# Patient Record
Sex: Male | Born: 1965 | Race: Black or African American | Hispanic: No | Marital: Single
Health system: Southern US, Community
[De-identification: ages and names within clinical notes are randomized; demographics above are authoritative.]

## PROBLEM LIST (undated history)

## (undated) SURGICAL SUPPLY — 1 items: SCREW CANN L THRD/28 4.0 (Screw) IMPLANT

---

## 2023-05-06 DIAGNOSIS — S12100A Unspecified displaced fracture of second cervical vertebra, initial encounter for closed fracture: Secondary | ICD-10-CM

## 2023-05-06 DIAGNOSIS — E1140 Type 2 diabetes mellitus with diabetic neuropathy, unspecified: Secondary | ICD-10-CM | POA: Diagnosis not present

## 2023-05-06 DIAGNOSIS — K922 Gastrointestinal hemorrhage, unspecified: Secondary | ICD-10-CM

## 2023-05-06 DIAGNOSIS — S065XAA Traumatic subdural hemorrhage with loss of consciousness status unknown, initial encounter: Secondary | ICD-10-CM

## 2023-05-06 DIAGNOSIS — S022XXA Fracture of nasal bones, initial encounter for closed fracture: Secondary | ICD-10-CM

## 2023-05-06 DIAGNOSIS — F05 Delirium due to known physiological condition: Secondary | ICD-10-CM

## 2023-05-06 DIAGNOSIS — I469 Cardiac arrest, cause unspecified: Principal | ICD-10-CM | POA: Diagnosis present

## 2023-05-06 DIAGNOSIS — S12001A Unspecified nondisplaced fracture of first cervical vertebra, initial encounter for closed fracture: Secondary | ICD-10-CM

## 2023-05-06 MED ORDER — ACETAMINOPHEN 325 MG PO TABS
650.0000 mg | ORAL_TABLET | ORAL | Status: DC | PRN
Start: 2023-05-08 — End: 2023-05-10

## 2023-05-06 MED ORDER — NOREPINEPHRINE 4 MG/250ML-% IV SOLN
2.0000 ug/min | INTRAVENOUS | Status: DC
Start: 2023-05-06 — End: 2023-05-11

## 2023-05-06 NOTE — ED Triage Notes (Signed)
Pt BIB EMS for cardiac arrest. Pt was leaving [employer] shop and collapsed outside. Pt hit head, arrives with c-collar being bagged through I-gel. Fire states rhythm was asystole, 2 rounds of CPR performed.  BP 260.

## 2023-05-06 NOTE — Progress Notes (Signed)
RT assisted with transport of this pt from [facility] in ED to CT and back while on full ventilatory support. Pt tolerated well with SVS.

## 2023-05-06 NOTE — Consult Note (Signed)
Reason for Consult:Cervical spine fracture Referring Physician: Dr. Francee Nodal is an 57 y.o. male.  HPI: The patient is a 57 year old individual is brought in cardiac arrest.  He underwent CPR for 2 rounds before he recovered ROSC.  He was intubated.  He appears to have struck his head when he fell out.  Because of this a CT of the head and neck were performed and the cervical CT demonstrates that the patient has a type II odontoid fracture with about 4 mm of distraction between the base of the odontoid and the PEG there is also some evidence of epidural blood posteriorly beyond that there is a C1 fracture in the ring and at least 2 spots.  The alignment of the neck is good and the spinal canal appears to be all in good proportion and alignment.  The epidural posterior to the C2 body appears small.  At the current time the patient is postarrest he is breathing spontaneously but assisted on a ventilator.  He has demonstrated some spontaneous movements of the upper and lower extremities.  His eyes are open and blinks spontaneously but he is not following any commands verbally.  He does not grimace to pinch.  At this point is early in the postresuscitation phase and the patient does not appear to have regained full consciousness.  I do not believe that he is LOC then.  Reviewing the CT scan I note that he is in good alignment.  Will continue to observe his status and see how his recovery progresses from a neurologic perspective.  History reviewed. No pertinent past medical history.    History reviewed. No pertinent family history.  Social History:  has no history on file for tobacco use, alcohol use, and drug use.  Allergies: Not on File  Medications: I have not reviewed the patient's medications  Results for orders placed or performed during the hospital encounter of 05/06/23 (from the past 48 hours)  Comprehensive metabolic panel     Status: Abnormal   Collection Time: 05/06/23  4:21 PM   Result Value Ref Range   Sodium 137 135 - 145 mmol/L   Potassium 3.4 (L) 3.5 - 5.1 mmol/L   Chloride 98 98 - 111 mmol/L   CO2 28 22 - 32 mmol/L   Glucose, Bld 219 (H) 70 - 99 mg/dL    Comment: Glucose reference range applies only to samples taken after fasting for at least 8 hours.   BUN 12 6 - 20 mg/dL   Creatinine, Ser 1.61 0.61 - 1.24 mg/dL   Calcium 8.9 8.9 - 09.6 mg/dL   Total Protein 7.8 6.5 - 8.1 g/dL   Albumin 3.3 (L) 3.5 - 5.0 g/dL   AST 045 (H) 15 - 41 U/L   ALT 111 (H) 0 - 44 U/L   Alkaline Phosphatase 150 (H) 38 - 126 U/L   Total Bilirubin 0.8 <1.2 mg/dL   GFR, Estimated >40 >98 mL/min    Comment: (NOTE) Calculated using the CKD-EPI Creatinine Equation (2021)    Anion gap 11 5 - 15    Comment: Performed at [facility], [address], [place], [place] [address]  CBC     Status: Abnormal   Collection Time: 05/06/23  4:21 PM  Result Value Ref Range   WBC 10.1 4.0 - 10.5 K/uL   RBC 4.27 4.22 - 5.81 MIL/uL   Hemoglobin 12.0 (L) 13.0 - 17.0 g/dL   HCT 78.2 (L) 95.6 - 21.3 %  MCV 90.9 80.0 - 100.0 fL   MCH 28.1 26.0 - 34.0 pg   MCHC 30.9 30.0 - 36.0 g/dL   RDW 16.1 09.6 - 04.5 %   Platelets 360 150 - 400 K/uL   nRBC 0.0 0.0 - 0.2 %    Comment: Performed at [facility], [address] [place], [place] [address]  Ethanol     Status: None   Collection Time: 05/06/23  4:21 PM  Result Value Ref Range   Alcohol, Ethyl (B) <10 <10 mg/dL    Comment: (NOTE) Lowest detectable limit for serum alcohol is 10 mg/dL.  For medical purposes only. Performed at [facility], [address], [place], [place] [address]   Protime-INR     Status: Abnormal   Collection Time: 05/06/23  4:21 PM  Result Value Ref Range   Prothrombin Time 15.8 (H) 11.4 - 15.2 seconds   INR 1.2 0.8 - 1.2    Comment: (NOTE) INR goal varies based on device and disease states. Performed at [facility], [address] [place], [place] [address]   Sample to Blood Bank      Status: None   Collection Time: 05/06/23  4:21 PM  Result Value Ref Range   Blood Bank Specimen SAMPLE AVAILABLE FOR TESTING    Sample Expiration      05/09/2023,2359 Performed at [facility], [address] [place], [place] [address]   Troponin I (High Sensitivity)     Status: None   Collection Time: 05/06/23  4:22 PM  Result Value Ref Range   Troponin I (High Sensitivity) 7 <18 ng/L    Comment: (NOTE) Elevated high sensitivity troponin I (hsTnI) values and significant  changes across serial measurements may suggest ACS but many other  chronic and acute conditions are known to elevate hsTnI results.  Refer to the "Links" section for chest pain algorithms and additional  guidance. Performed at [facility], [address] [place], [place] [address]   Brain natriuretic peptide     Status: None   Collection Time: 05/06/23  4:22 PM  Result Value Ref Range   B Natriuretic Peptide 16.6 0.0 - 100.0 pg/mL    Comment: Performed at [facility], [address], [place], [place] [address]  CDS serology     Status: None   Collection Time: 05/06/23  4:22 PM  Result Value Ref Range   CDS serology specimen      SPECIMEN WILL BE HELD FOR 14 DAYS IF TESTING IS REQUIRED    Comment: Performed at [facility], [address], [place], [place] [address]  Urinalysis, Routine w reflex microscopic -Urine, Catheterized     Status: Abnormal   Collection Time: 05/06/23  4:28 PM  Result Value Ref Range   Color, Urine YELLOW YELLOW   APPearance CLEAR CLEAR   Specific Gravity, Urine 1.016 1.005 - 1.030   pH 5.0 5.0 - 8.0   Glucose, UA 150 (A) NEGATIVE mg/dL   Hgb urine dipstick SMALL (A) NEGATIVE   Bilirubin Urine NEGATIVE NEGATIVE   Ketones, ur NEGATIVE NEGATIVE mg/dL   Protein, ur 324 (A) NEGATIVE mg/dL   Nitrite NEGATIVE NEGATIVE   Leukocytes,Ua NEGATIVE NEGATIVE   RBC / HPF >50 0 - 5 RBC/hpf   WBC, UA 0-5 0 - 5 WBC/hpf   Bacteria, UA RARE (A) NONE SEEN   Squamous  Epithelial / HPF 0-5 0 - 5 /HPF   Hyaline  Casts, UA PRESENT     Comment: Performed at [facility], [address], [place], [place] [address]  POC CBG, ED     Status: Abnormal   Collection Time: 05/06/23  4:30 PM  Result Value Ref Range   Glucose-Capillary 204 (H) 70 - 99 mg/dL    Comment: Glucose reference range applies only to samples taken after fasting for at least 8 hours.  I-Stat Chem 8, ED     Status: Abnormal   Collection Time: 05/06/23  4:32 PM  Result Value Ref Range   Sodium 138 135 - 145 mmol/L   Potassium 3.4 (L) 3.5 - 5.1 mmol/L   Chloride 96 (L) 98 - 111 mmol/L   BUN 14 6 - 20 mg/dL   Creatinine, Ser 6.04 0.61 - 1.24 mg/dL   Glucose, Bld 540 (H) 70 - 99 mg/dL    Comment: Glucose reference range applies only to samples taken after fasting for at least 8 hours.   Calcium, Ion 1.10 (L) 1.15 - 1.40 mmol/L   TCO2 30 22 - 32 mmol/L   Hemoglobin 12.9 (L) 13.0 - 17.0 g/dL   HCT 98.1 (L) 19.1 - 47.8 %  I-Stat Lactic Acid, ED     Status: Abnormal   Collection Time: 05/06/23  4:33 PM  Result Value Ref Range   Lactic Acid, Venous 3.0 (HH) 0.5 - 1.9 mmol/L   Comment NOTIFIED PHYSICIAN   I-Stat arterial blood gas, ED     Status: Abnormal   Collection Time: 05/06/23  5:26 PM  Result Value Ref Range   pH, Arterial 7.397 7.35 - 7.45   pCO2 arterial 55.4 (H) 32 - 48 mmHg   pO2, Arterial 201 (H) 83 - 108 mmHg   Bicarbonate 34.6 (H) 20.0 - 28.0 mmol/L   TCO2 36 (H) 22 - 32 mmol/L   O2 Saturation 100 %   Acid-Base Excess 7.0 (H) 0.0 - 2.0 mmol/L   Sodium 135 135 - 145 mmol/L   Potassium 3.5 3.5 - 5.1 mmol/L   Calcium, Ion 1.17 1.15 - 1.40 mmol/L   HCT 38.0 (L) 39.0 - 52.0 %   Hemoglobin 12.9 (L) 13.0 - 17.0 g/dL   Patient temperature 29.5 F    Sample type ARTERIAL     CT ANGIO HEAD NECK W WO CM Result Date: 05/06/2023 CLINICAL DATA:  Cervical spine fracture with concern for arterial injury EXAM: CT ANGIOGRAPHY HEAD AND NECK WITH AND WITHOUT CONTRAST TECHNIQUE:  Multidetector CT imaging of the head and neck was performed using the standard protocol during bolus administration of intravenous contrast. Multiplanar CT image reconstructions and MIPs were obtained to evaluate the vascular anatomy. Carotid stenosis measurements (when applicable) are obtained utilizing NASCET criteria, using the distal internal carotid diameter as the denominator. RADIATION DOSE REDUCTION: This exam was performed according to the departmental dose-optimization program which includes automated exposure control, adjustment of the mA and/or kV according to patient size and/or use of iterative reconstruction technique. CONTRAST:  75mL OMNIPAQUE IOHEXOL 350 MG/ML SOLN COMPARISON:  No prior CTA available, correlation is made with 05/06/2023 CT head FINDINGS: CT HEAD FINDINGS Brain: Increasing hyperdensity along the posterior falx and tentorium, likely trace subdural hemorrhage, which was not notable on the prior exam. No evidence of acute infarct, hemorrhage, mass, mass effect, or midline shift. No hydrocephalus. Vascular: No hyperdense vessel. Skull: Negative for fracture or focal lesion. Right frontal scalp hematoma. Could Sinuses/Orbits: Mucosal thickening in the right greater than left maxillary sinus, ethmoid air cells,  and right greater than left frontal sinuses. Other: The mastoid air cells are well aerated. CTA NECK FINDINGS Aortic arch: Two-vessel arch with a common origin of the brachiocephalic and left common carotid arteries. Imaged portion shows no evidence of aneurysm or dissection. No significant stenosis of the major arch vessel origins. Right carotid system: No evidence of dissection, occlusion, or hemodynamically significant stenosis (greater than 50%). Left carotid system: No evidence of dissection, occlusion, or hemodynamically significant stenosis (greater than 50%). Vertebral arteries: The left vertebral artery is diminutive throughout its course. No definite abnormality is seen in  the left vertebral artery as it passes by the fracture of the left C1 vertebral artery foramen (series 10, image 170). The right vertebral artery is dominant and is patent from its origin to the skull base, without evidence of dissection or hemodynamically significant stenosis. Skeleton: For detailed osseous findings, please see same-day CT head and cervical spine. Compared to the recent CT cervical spine, there is now increased anterior displacement of the dens relative to the body of C2, measuring approximately 4 mm (series 12, image 123), previously up to 1 mm. Other neck: No acute finding. Upper chest: For findings in the thorax, please see same day CT chest. Review of the MIP images confirms the above findings CTA HEAD FINDINGS Anterior circulation: Both internal carotid arteries are patent to the termini, with calcifications but without significant stenosis. A1 segments patent. Normal anterior communicating artery. Anterior cerebral arteries are patent to their distal aspects without significant stenosis. No M1 stenosis or occlusion. MCA branches perfused to their distal aspects without significant stenosis. Posterior circulation: Vertebral arteries patent to the vertebrobasilar junction, with possible severe stenosis distal left V4 (series 10, image 136). Basilar patent to its distal aspect without significant stenosis. Superior cerebellar arteries patent proximally. Patent P1 segments. Severe stenosis in the left P2 (series 12, image 139). PCAs otherwise perfused to their distal aspects without significant stenosis. The bilateral posterior communicating arteries are not visualized. Venous sinuses: As permitted by contrast timing, patent. Anatomic variants: None significant. No evidence of aneurysm or vascular malformation. Review of the MIP images confirms the above findings IMPRESSION: 1. Increasing hyperdensity along the posterior falx and tentorium, likely trace subdural hemorrhage, which is new from the  prior exam. 2. The left vertebral artery is diminutive throughout its course. No definite acute abnormality is seen in the left vertebral artery as it passes by the fracture of the left C1 vertebral artery foramen. 3. Compared to the recent CT cervical spine, there is now increased anterior displacement of the dens relative to the body of C2, measuring approximately 4 mm, previously up to 1 mm. 4. Possible severe stenosis in the distal left V4 and left P2. Imaging results were communicated on 05/06/2023 at 6:33 pm to provider [facility] via secure text paging. Electronically Signed   By: Wiliam Ke M.D.   On: 05/06/2023 18:33   DG Chest Port 1 View Result Date: 05/06/2023 CLINICAL DATA:  Pain after trauma EXAM: PORTABLE CHEST 1 VIEW COMPARISON:  X-ray 04/15/2023 FINDINGS: ET tube in place with tip 3 cm above the carina proximally. Enteric tube in place with tip extending beneath the diaphragm. Underinflation with widened mediastinum. Enlarged cardiopericardial silhouette. Vascular congestion. No pneumothorax or effusion. Overlapping cardiac leads. Left shoulder arthroplasty seen at the edge of the imaging field. IMPRESSION: New ET tube and enteric tube. Underinflation with enlarged cardiopericardial silhouette with vascular congestion widened mediastinum. With changes recommend further workup with contrast CT when clinically appropriate  Electronically Signed   By: Karen Kays M.D.   On: 05/06/2023 18:26   CT Angio Chest PE W and/or Wo Contrast Result Date: 05/06/2023 CLINICAL DATA:  Cardiac arrest. Blunt abdominal trauma. Cervical spine fracture. EXAM: CT ANGIOGRAPHY CHEST CT ABDOMEN AND PELVIS WITH CONTRAST TECHNIQUE: Multidetector CT imaging of the chest was performed using the standard protocol during bolus administration of intravenous contrast. Multiplanar CT image reconstructions and MIPs were obtained to evaluate the vascular anatomy. Multidetector CT imaging of the abdomen and pelvis was performed  using the standard protocol during bolus administration of intravenous contrast. RADIATION DOSE REDUCTION: This exam was performed according to the departmental dose-optimization program which includes automated exposure control, adjustment of the mA and/or kV according to patient size and/or use of iterative reconstruction technique. CONTRAST:  90mL Omnipaque 350 for CT chest 75 cc Omnipaque 354 CT abdomen/pelvis performed about 70 minutes after the CT chest COMPARISON:  Chest radiograph 05/06/2023, CT abdomen 12/19/2018 FINDINGS: CTA CHEST FINDINGS Cardiovascular: Atherosclerotic calcification of the aortic arch. Moderate cardiomegaly. No filling defect is identified in the pulmonary arterial tree to suggest pulmonary embolus. Mediastinum/Nodes: Mild thyromegaly without focal nodule identified. Lungs/Pleura: Endotracheal tube satisfactorily positioned. Patchy bandlike airspace opacities in both lower lobes mostly from atelectasis, aspiration pneumonitis not excluded. There is also some dependent airspace opacity in the right upper lobe and bandlike atelectasis in the apicoposterior segment left upper lobe. Musculoskeletal: Degenerative glenohumeral arthropathy on the right with left shoulder arthroplasty. Thoracic spondylosis. Review of the MIP images confirms the above findings. CT ABDOMEN and PELVIS FINDINGS Hepatobiliary: Unremarkable Pancreas: Unremarkable Spleen: Absent.  Minimal regenerative splenic nodularity. Adrenals/Urinary Tract: Foley catheter satisfactorily positioned in the urinary bladder. Kidneys unremarkable. Stomach/Bowel: Nasogastric tube tip in the stomach body. Mild thickening of portions of the appendix up to 1.0 cm in diameter without surrounding inflammatory findings. Strictly speaking, mild appendicitis is not excluded, and appendiceal mass could also have this appearance. Vascular/Lymphatic: IVC filter noted with very small caliber IVC and with substantial venous collateral structures in  the abdomen and subcutaneous tissues as before. Mild aortoiliac atheromatous vascular calcifications. Reproductive: Unremarkable Other: No supplemental non-categorized findings. Musculoskeletal: Remote superior endplate compressions at L1 and L2, no change from 12/19/2018. Review of the MIP images confirms the above findings. IMPRESSION: 1. No filling defect is identified in the pulmonary arterial tree to suggest pulmonary embolus. 2. Patchy bandlike airspace opacities in both lower lobes mostly from atelectasis, aspiration pneumonitis not excluded. There is also some dependent airspace opacity in the right upper lobe and bandlike atelectasis in the apicoposterior segment left upper lobe. 3. Moderate cardiomegaly. 4. IVC filter noted with very small caliber IVC and with substantial venous collateral structures in the abdomen and subcutaneous tissues as before. 5. Mild thickening of portions of the appendix up to 1.0 cm in diameter without surrounding inflammatory findings. Strictly speaking, mild appendicitis is not excluded, and appendiceal mass could also have this appearance (although no appendiceal mass was visible on the CT from 12/19/2018). 6. Remote superior endplate compressions at L1 and L2, no change from 12/19/2018. 7. Aortic atherosclerosis. Aortic Atherosclerosis (ICD10-I70.0). Electronically Signed   By: Gaylyn Rong M.D.   On: 05/06/2023 18:25   CT ABDOMEN PELVIS W CONTRAST Result Date: 05/06/2023 CLINICAL DATA:  Cardiac arrest. Blunt abdominal trauma. Cervical spine fracture. EXAM: CT ANGIOGRAPHY CHEST CT ABDOMEN AND PELVIS WITH CONTRAST TECHNIQUE: Multidetector CT imaging of the chest was performed using the standard protocol during bolus administration of intravenous contrast. Multiplanar CT image reconstructions  and MIPs were obtained to evaluate the vascular anatomy. Multidetector CT imaging of the abdomen and pelvis was performed using the standard protocol during bolus administration  of intravenous contrast. RADIATION DOSE REDUCTION: This exam was performed according to the departmental dose-optimization program which includes automated exposure control, adjustment of the mA and/or kV according to patient size and/or use of iterative reconstruction technique. CONTRAST:  90mL Omnipaque 350 for CT chest 75 cc Omnipaque 354 CT abdomen/pelvis performed about 70 minutes after the CT chest COMPARISON:  Chest radiograph 05/06/2023, CT abdomen 12/19/2018 FINDINGS: CTA CHEST FINDINGS Cardiovascular: Atherosclerotic calcification of the aortic arch. Moderate cardiomegaly. No filling defect is identified in the pulmonary arterial tree to suggest pulmonary embolus. Mediastinum/Nodes: Mild thyromegaly without focal nodule identified. Lungs/Pleura: Endotracheal tube satisfactorily positioned. Patchy bandlike airspace opacities in both lower lobes mostly from atelectasis, aspiration pneumonitis not excluded. There is also some dependent airspace opacity in the right upper lobe and bandlike atelectasis in the apicoposterior segment left upper lobe. Musculoskeletal: Degenerative glenohumeral arthropathy on the right with left shoulder arthroplasty. Thoracic spondylosis. Review of the MIP images confirms the above findings. CT ABDOMEN and PELVIS FINDINGS Hepatobiliary: Unremarkable Pancreas: Unremarkable Spleen: Absent.  Minimal regenerative splenic nodularity. Adrenals/Urinary Tract: Foley catheter satisfactorily positioned in the urinary bladder. Kidneys unremarkable. Stomach/Bowel: Nasogastric tube tip in the stomach body. Mild thickening of portions of the appendix up to 1.0 cm in diameter without surrounding inflammatory findings. Strictly speaking, mild appendicitis is not excluded, and appendiceal mass could also have this appearance. Vascular/Lymphatic: IVC filter noted with very small caliber IVC and with substantial venous collateral structures in the abdomen and subcutaneous tissues as before. Mild  aortoiliac atheromatous vascular calcifications. Reproductive: Unremarkable Other: No supplemental non-categorized findings. Musculoskeletal: Remote superior endplate compressions at L1 and L2, no change from 12/19/2018. Review of the MIP images confirms the above findings. IMPRESSION: 1. No filling defect is identified in the pulmonary arterial tree to suggest pulmonary embolus. 2. Patchy bandlike airspace opacities in both lower lobes mostly from atelectasis, aspiration pneumonitis not excluded. There is also some dependent airspace opacity in the right upper lobe and bandlike atelectasis in the apicoposterior segment left upper lobe. 3. Moderate cardiomegaly. 4. IVC filter noted with very small caliber IVC and with substantial venous collateral structures in the abdomen and subcutaneous tissues as before. 5. Mild thickening of portions of the appendix up to 1.0 cm in diameter without surrounding inflammatory findings. Strictly speaking, mild appendicitis is not excluded, and appendiceal mass could also have this appearance (although no appendiceal mass was visible on the CT from 12/19/2018). 6. Remote superior endplate compressions at L1 and L2, no change from 12/19/2018. 7. Aortic atherosclerosis. Aortic Atherosclerosis (ICD10-I70.0). Electronically Signed   By: Gaylyn Rong M.D.   On: 05/06/2023 18:25   CT HEAD WO CONTRAST Result Date: 05/06/2023 CLINICAL DATA:  Cardiac arrest, collapse with head strike EXAM: CT HEAD WITHOUT CONTRAST CT CERVICAL SPINE WITHOUT CONTRAST TECHNIQUE: Multidetector CT imaging of the head and cervical spine was performed following the standard protocol without intravenous contrast. Multiplanar CT image reconstructions of the cervical spine were also generated. RADIATION DOSE REDUCTION: This exam was performed according to the departmental dose-optimization program which includes automated exposure control, adjustment of the mA and/or kV according to patient size and/or use  of iterative reconstruction technique. COMPARISON:  03/17/2023 CTA head and neck FINDINGS: CT HEAD FINDINGS Brain: No evidence of acute infarct, hemorrhage, mass, mass effect, or midline shift. No hydrocephalus or extra-axial fluid collection. Vascular: No  hyperdense vessel. Skull: Negative for calvarial fracture or focal lesion. Right frontal scalp hematoma. Fracture of the bilateral nasal bones. Possible fracture of the osseous nasal septum. Sinuses/Orbits: Partial opacification of the right frontal sinus and ethmoid air cells. No acute finding in the orbits. Other: The mastoid air cells are well aerated. CT CERVICAL SPINE FINDINGS Alignment: No traumatic listhesis. Relative to the [id number] cm, there is widening of the facets at C1-C2, left greater than right and at C2-C3. Skull base and vertebrae: Acute fracture through the base of the dens (series 10, image 28 and series 9, image 53), with approximately 4 mm of distraction. Additional multifocal fractures of the anterior and posterior arch of C1 (series 3, images 26-29 and series 9, images 42, 47, and 68), including a fracture involving the left C1 vertebral artery foramen (series 3, image 31). Soft tissues and spinal canal: Suspect prevertebral edema and epidural hemorrhage at C1-C2. Endotracheal and orogastric tubes are noted. Disc levels: Degenerative changes in the cervical spine.No high-grade spinal canal stenosis. Upper chest: For findings in the thorax, please see same day CT chest. IMPRESSION: 1. Acute fracture through the base of the dens, with approximately 4 mm of distraction. Additional multifocal fractures of the anterior and posterior arch of C1, including a fracture involving the left C1 vertebral artery foramen. CTA of the neck is recommended to evaluate for vertebral artery injury. 2. Widening of the facets at C1-C2, left greater than right, and at C2-C3, concerning for ligamentous injury. 3. Suspect prevertebral edema and epidural hemorrhage at  C1-C2. 4. Fracture of the bilateral nasal bones. Possible fracture of the osseous nasal septum. 5. No acute intracranial process. These results were called by telephone at the time of interpretation on 05/06/2023 at 5:36 pm to provider [facility] , who verbally acknowledged these results. Electronically Signed   By: Wiliam Ke M.D.   On: 05/06/2023 17:36   CT CERVICAL SPINE WO CONTRAST Result Date: 05/06/2023 CLINICAL DATA:  Cardiac arrest, collapse with head strike EXAM: CT HEAD WITHOUT CONTRAST CT CERVICAL SPINE WITHOUT CONTRAST TECHNIQUE: Multidetector CT imaging of the head and cervical spine was performed following the standard protocol without intravenous contrast. Multiplanar CT image reconstructions of the cervical spine were also generated. RADIATION DOSE REDUCTION: This exam was performed according to the departmental dose-optimization program which includes automated exposure control, adjustment of the mA and/or kV according to patient size and/or use of iterative reconstruction technique. COMPARISON:  03/17/2023 CTA head and neck FINDINGS: CT HEAD FINDINGS Brain: No evidence of acute infarct, hemorrhage, mass, mass effect, or midline shift. No hydrocephalus or extra-axial fluid collection. Vascular: No hyperdense vessel. Skull: Negative for calvarial fracture or focal lesion. Right frontal scalp hematoma. Fracture of the bilateral nasal bones. Possible fracture of the osseous nasal septum. Sinuses/Orbits: Partial opacification of the right frontal sinus and ethmoid air cells. No acute finding in the orbits. Other: The mastoid air cells are well aerated. CT CERVICAL SPINE FINDINGS Alignment: No traumatic listhesis. Relative to the [id number] cm, there is widening of the facets at C1-C2, left greater than right and at C2-C3. Skull base and vertebrae: Acute fracture through the base of the dens (series 10, image 28 and series 9, image 53), with approximately 4 mm of distraction. Additional  multifocal fractures of the anterior and posterior arch of C1 (series 3, images 26-29 and series 9, images 42, 47, and 68), including a fracture involving the left C1 vertebral artery foramen (series 3, image 31). Soft tissues and spinal  canal: Suspect prevertebral edema and epidural hemorrhage at C1-C2. Endotracheal and orogastric tubes are noted. Disc levels: Degenerative changes in the cervical spine.No high-grade spinal canal stenosis. Upper chest: For findings in the thorax, please see same day CT chest. IMPRESSION: 1. Acute fracture through the base of the dens, with approximately 4 mm of distraction. Additional multifocal fractures of the anterior and posterior arch of C1, including a fracture involving the left C1 vertebral artery foramen. CTA of the neck is recommended to evaluate for vertebral artery injury. 2. Widening of the facets at C1-C2, left greater than right, and at C2-C3, concerning for ligamentous injury. 3. Suspect prevertebral edema and epidural hemorrhage at C1-C2. 4. Fracture of the bilateral nasal bones. Possible fracture of the osseous nasal septum. 5. No acute intracranial process. These results were called by telephone at the time of interpretation on 05/06/2023 at 5:36 pm to provider [facility] , who verbally acknowledged these results. Electronically Signed   By: Wiliam Ke M.D.   On: 05/06/2023 17:36    Review of Systems  Unable to perform ROS: Acuity of condition   Blood pressure (!) 138/93, pulse (!) 103, temperature (!) 96.8 F (36 C), temperature source Bladder, resp. rate 14, height 6\' 2"  (1.88 m), weight 127 kg, SpO2 100%. Physical Exam Constitutional:      Appearance: He is obese.  HENT:     Head: Normocephalic.     Comments: Small laceration in the frontal region that his crusted over with blood.    Nose: Nose normal.     Mouth/Throat:     Mouth: Mucous membranes are moist.  Eyes:     Conjunctiva/sclera: Conjunctivae normal.     Comments: Pupils are  equal and briskly reactive.  Cannot test accommodation.  Cannot test extraocular movements.  Neurological:     Comments: Patient is recently postarrest.  He has some spontaneous respirations above the ventilator.  He has demonstrated some spontaneous movement in the upper and lower extremities.  He does not move to command does not respond to painful stimuli at this time.     Assessment/Plan: C1 and C2 fractures.  Good anatomic alignment.  Small epidural hematoma.  Status post cardiac arrest.  Plan: The patient is in a hard cervical collar to maintain his alignment.  At this point I would allow him to recover further from the recent cardiac arrest and see if neurologic functionality improves further.  Ultimately would like to obtain an MRI of the cervical spine but I do not feel that that is emergent at the current time.  Will follow-up on the patient in the next day.  Shary Key Javoni Lucken 05/06/2023, 8:07 PM

## 2023-05-06 NOTE — H&P (Signed)
NAMEMasen Henderson, MRN:  [id number], DOB:  [birth year], LOS: 0 ADMISSION DATE:  05/06/2023, CONSULTATION DATE:  05/06/2023 REFERRING MD:  Dr. Karene Fry - EDP, CHIEF COMPLAINT:  Cardiac arrest    History of Present Illness:  Glenn Henderson is a 57 year old male with past medical history significant for hyperlipidemia, OSA, history of hepatitis, MDD, anxiety, PTSD, prior tobacco abuse, DM2 with peripheral neuropathy and latest A1c 12.4, chronic radicular lumbar pain with chronic pain syndrome, history of DVT and PE on chronic anticoagulation with Eliquis, TBI 2013 with left-sided deficits, history of seizures, abnormal thyroid disorder, who presented to the ED 12/16 via EMS after suffering out-of-hospital cardiac arrest.  Patient required 2 rounds of CPR prior to ROSC. Per EMS patient did hit head when he arrested, arrived in c-collar. On arrival patient was seen hypertensive, mildly hypothermic and tachycardic. Lab work significant for K 3.4, glucose 219, alkaline phosphastat 150, albumin 3.3, AST 142, ALT 111, lactic 3.0, Hgb 12.0. CT cervical spine on admit positive for acute unstable fracture with epidural hemorrhage. PCCM consulted for further assistance and admisison  Pertinent  Medical History  HTN, OSA, diabetes, prior CVA, TBI with hemipparesis, CKD, DVT and PE   Significant Hospital Events: Including procedures, antibiotic start and stop dates in addition to other pertinent events   12/16 admitted post cardiac arrest  Interim History / Subjective:  As above   Objective   Blood pressure (!) 172/117, pulse (!) 111, temperature (!) 95.9 F (35.5 C), temperature source Oral, resp. rate 16, height 6\' 2"  (1.88 m), weight 127 kg.    Vent Mode: PRVC FiO2 (%):  [40 %-100 %] 40 % Set Rate:  [14 bmp-16 bmp] 14 bmp Vt Set:  [650 mL] 650 mL PEEP:  [5 cmH20] 5 cmH20 Plateau Pressure:  [28 cmH20] 28 cmH20  No intake or output data in the 24 hours ending 05/06/23 1811 Filed Weights   05/06/23 1616   Weight: 127 kg    Examination: Deferred to attending   Resolved Hospital Problem list     Assessment & Plan:  Out of hospital cardiac arrest -Required 2 round CPR prior to ROSC  Essential HTN Hyperlipidemia  P: Goal of normothermia  Continuous telemetry  Assess CVP's, echo, UDS. Trend troponin, lactate. Consider cardiology consult  Optimize electrolytes   Respiratory insufficiency secondary to above  Hx of OSA Prior tobacco use P: Continue ventilator support with lung protective strategies  Wean PEEP and FiO2 for sats greater than 90%. Head of bed elevated 30 degrees. Plateau pressures less than 30 cm H20.  Follow intermittent chest x-ray and ABG.   SAT/SBT as tolerated, mentation preclude extubation  Ensure adequate pulmonary hygiene  Follow cultures  VAP bundle in place  PAD protocol  Unstable C-spine fracture with possible epidural hematoma  At risk anoxic injury  TBI 2013 with left-sided deficits, Prior CVA, TBI with hemipparesis P: Maintain neuro protective measures; goal for eurothermia, euglycemia, eunatermia, normoxia, and PCO2 goal of 35-40 Nutrition and bowel regiment  Seizure precautions  Aspirations precautions   History of DVT and PE on chronic anticoagulation with Eliquis,  P: Await neurosurgery consult to determine need to revers anticoagulation  Hold home Eliquis   History of hepatitis Elevated LFTs P: Consider ABD [place]  Trend LFTs  Avoid hepatotoxins   DM2 with peripheral neuropathy  -02/2023 A1c 12.4 P: SSI  CBG goal 140-180 CBG checks q4   Best Practice (right click and "Reselect all SmartList Selections" daily)   Diet/type: NPO  DVT prophylaxis SCD Pressure ulcer(s): N/A GI prophylaxis: PPI Lines: N/A Foley:  Yes, and it is still needed Code Status:  full code Last date of multidisciplinary goals of care discussion: Pending   Labs   CBC: Recent Labs  Lab 05/06/23 1621 05/06/23 1632 05/06/23 1726  WBC 10.1  --   --    HGB 12.0* 12.9* 12.9*  HCT 38.8* 38.0* 38.0*  MCV 90.9  --   --   PLT 360  --   --     Basic Metabolic Panel: Recent Labs  Lab 05/06/23 1621 05/06/23 1632 05/06/23 1726  NA 137 138 135  K 3.4* 3.4* 3.5  CL 98 96*  --   CO2 28  --   --   GLUCOSE 219* 232*  --   BUN 12 14  --   CREATININE 1.17 1.20  --   CALCIUM 8.9  --   --    GFR: Estimated Creatinine Clearance: 98.5 mL/min (by C-G formula based on SCr of 1.2 mg/dL). Recent Labs  Lab 05/06/23 1621 05/06/23 1633  WBC 10.1  --   LATICACIDVEN  --  3.0*    Liver Function Tests: Recent Labs  Lab 05/06/23 1621  AST 142*  ALT 111*  ALKPHOS 150*  BILITOT 0.8  PROT 7.8  ALBUMIN 3.3*   No results for input(s): "LIPASE", "AMYLASE" in the last 168 hours. No results for input(s): "AMMONIA" in the last 168 hours.  ABG    Component Value Date/Time   PHART 7.397 05/06/2023 1726   PCO2ART 55.4 (H) 05/06/2023 1726   PO2ART 201 (H) 05/06/2023 1726   HCO3 34.6 (H) 05/06/2023 1726   TCO2 36 (H) 05/06/2023 1726   O2SAT 100 05/06/2023 1726     Coagulation Profile: Recent Labs  Lab 05/06/23 1621  INR 1.2    Cardiac Enzymes: No results for input(s): "CKTOTAL", "CKMB", "CKMBINDEX", "TROPONINI" in the last 168 hours.  HbA1C: No results found for: "HGBA1C"  CBG: Recent Labs  Lab 05/06/23 1630  GLUCAP 204*    Review of Systems:   Unable to assess   Past Medical History:  He,  has no past medical history on file.   Surgical History:     Social History:      Family History:  His family history is not on file.   Allergies Not on File   Home Medications  Prior to Admission medications   Medication Sig Start Date End Date Taking? Authorizing Provider  acetaminophen-codeine (TYLENOL #3) 300-30 MG tablet Take 1 tablet by mouth every 4 (four) hours as needed. 04/22/23   [provider]  atorvastatin (LIPITOR) 40 MG tablet Take 40 mg by mouth daily. 04/13/23   [provider]   chlorhexidine (PERIDEX) 0.12 % solution Use as directed 15 mLs in the mouth or throat in the morning, at noon, and at bedtime. 04/22/23   [provider]  clonazePAM (KLONOPIN) 0.5 MG tablet Take 0.5 mg by mouth 2 (two) times daily as needed. 04/12/23   [provider]  cloNIDine (CATAPRES) 0.1 MG tablet Take 0.1 mg by mouth 2 (two) times daily as needed. 04/12/23   [provider]  DULoxetine (CYMBALTA) 30 MG capsule Take 30 mg by mouth every morning. 05/01/23   [provider]  ELIQUIS 5 MG TABS tablet Take 5 mg by mouth 2 (two) times daily. 03/18/23   [provider]  fluticasone (FLONASE) 50 MCG/ACT nasal spray Place 2 sprays into both nostrils at bedtime. 04/28/23  [provider]  furosemide (LASIX) 20 MG tablet Take 20 mg by mouth daily as needed. 04/01/23   [provider]  furosemide (LASIX) 40 MG tablet Take 40 mg by mouth daily as needed. 04/08/23   [provider]  gabapentin (NEURONTIN) 800 MG tablet Take 800 mg by mouth 3 (three) times daily. 04/09/23   [provider]  HUMALOG KWIKPEN 100 UNIT/ML KwikPen Inject 2-10 Units into the skin as directed. 04/08/23   [provider]  hydrochlorothiazide (HYDRODIURIL) 12.5 MG tablet Take 12.5 mg by mouth daily. 04/23/23   [provider]  losartan (COZAAR) 100 MG tablet Take 100 mg by mouth daily. 04/20/23   [provider]  mirtazapine (REMERON) 45 MG tablet Take 45 mg by mouth at bedtime. 05/01/23   [provider]  oxyCODONE-acetaminophen (PERCOCET/ROXICET) 5-325 MG tablet Take 1 tablet by mouth every 6 (six) hours as needed. 04/19/23   [provider]  sildenafil (REVATIO) 20 MG tablet Take 20 mg by mouth as needed. 04/19/23   [provider]  TRULICITY 1.5 MG/0.5ML SOAJ Inject 1.5 mg into the skin once a week. 03/29/23   [provider]  Vitamin D, Ergocalciferol, (DRISDOL) 1.25 MG (50000 UNIT) CAPS  capsule Take 50,000 Units by mouth once a week. 04/20/23   [provider]     Critical care time:   CRITICAL CARE Performed by: Arthea Nobel D. Doe   Total critical care time: 42 minutes  Critical care time was exclusive of separately billable procedures and treating other patients.  Critical care was necessary to treat or prevent imminent or life-threatening deterioration.  Critical care was time spent personally by me on the following activities: development of treatment plan with patient and/or surrogate as well as nursing, discussions with consultants, evaluation of patient's response to treatment, examination of patient, obtaining history from patient or surrogate, ordering and performing treatments and interventions, ordering and review of laboratory studies, ordering and review of radiographic studies, pulse oximetry and re-evaluation of patient's condition.  Gresham Caetano D. Flinchum, NP-C [facility] Personal contact information can be found on Amion  If no contact or response made please call [phone] 05/06/2023, 6:39 PM

## 2023-05-06 NOTE — Progress Notes (Signed)
   05/06/23 1740  Spiritual Encounters  Type of Visit Initial  Care provided to: Pt not available  Referral source Trauma page  Reason for visit Trauma  OnCall Visit No   Chaplain responded to a level one trauma. The patient was attended to by the medial team. I met briefly with family in the consult room. They declined chaplain support at this time.  If a chaplain is requested someone will respond.   Valerie Roys [facility]  [phone]

## 2023-05-06 NOTE — Progress Notes (Addendum)
eLink Physician-Brief Progress Note Patient Name: Glenn Henderson DOB: March 27, 1966 MRN: [id number]   Date of Service  05/06/2023  HPI/Events of Note  57 year old male with a history of obesity, and DVT, and pulmonary emboli on chronic anticoagulation that was initially brought in for cardiac arrest with a traumatic C1-C2 injury complicated by an epidural bleed in the cervical region and laceration in the frontal region.  On examination, the patient has normal vitals, saturating 99% on 40% FiO2 on the ventilator.  Currently sedated with propofol and continuous fentanyl.  Norepinephrine is off.  Laboratory studies show adequate oxygenation and ventilation on the gas.  Metabolic panel with some mild electrolyte abnormalities, transaminitis, and mild anemia.  Extensive trauma screen without evidence of PE, previously identified fractures noted, IVC filter in place.  Endotracheal tube in appropriate position, gastric tube terminates in the stomach.  eICU Interventions  Maintain mechanical ventilation, propofol and fentanyl as needed for sedation.  Daily awakening trials and spontaneous breathing trials.  Empiric Unasyn.  Avoid fevers.  Maintain cervical hard collar  Will follow CT head timed for 0100  DVT prophylaxis-apixaban, SCDs GI prophylaxis with famotidine   0238 -   Accounting for change in technique, the degree of bleeding appears relatively stable.  Will hold on reversing anticoagulation for now.  Defer additional interval scan to the neurosurgical team.  Will follow for the final radiology read.  Intervention Category Evaluation Type: New Patient Evaluation  Teala Daffron 05/06/2023, 8:21 PM

## 2023-05-06 NOTE — ED Notes (Signed)
Trauma RN and this RN exchanged c-collar to [employer]

## 2023-05-06 NOTE — Progress Notes (Signed)
Pharmacy Antibiotic Note  Glenn Henderson is a 57 y.o. male admitted on 05/06/2023 with aspiration pneumonia s/p cardiac arrest.  Pharmacy has been consulted for Unasyn (ampicillin-sulbactam) dosing.  WBC 10.1, Temp 96.8, LA 3 SCr 1.2  Plan: Unasyn 3g IV q6h  Monitor daily CBC, temp, SCr, and for clinical signs of improvement  F/u cultures and de-escalate antibiotics as able   Height: 6\' 2"  (188 cm) Weight: 127 kg (280 lb) IBW/kg (Calculated) : 82.2  Temp (24hrs), Avg:96.4 F (35.8 C), Min:95.9 F (35.5 C), Max:96.8 F (36 C)  Recent Labs  Lab 05/06/23 1621 05/06/23 1632 05/06/23 1633  WBC 10.1  --   --   CREATININE 1.17 1.20  --   LATICACIDVEN  --   --  3.0*    Estimated Creatinine Clearance: 98.5 mL/min (by C-G formula based on SCr of 1.2 mg/dL).    Not on File  Antimicrobials this admission: Unasyn 12/16 >>    Dose adjustments this admission: N/A  Microbiology results: None  Thank you for allowing pharmacy to be a part of this patient's care.  Wilburn Cornelia, PharmD, BCPS Clinical Pharmacist 05/06/2023 7:22 PM   Please refer to AMION for pharmacy phone number

## 2023-05-06 NOTE — ED Provider Notes (Signed)
[place] EMERGENCY DEPARTMENT AT [facility] Provider Note   CSN: [id number] Arrival date & time: 05/06/23  1609     History  Chief Complaint  Patient presents with   Cardiac Arrest    Nickolaos Noe is a 57 y.o. male.   Cardiac Arrest    57 year old male with medical history significant for hypertension, prior , OSA, diabetes mellitus on insulin, CVA, TBI with left hemiparesis, CKD, DVT and PE, depression presenting to the emergency department status post cardiac arrest.  Per EMS, the patient was leaving a barbershop and collapsed outside.  He struck his head and arrives with a c-collar in place, was being bagged through an Igel.  Rhythm noted for 2 rounds of CPR was PEA.  Subsequent ROSC with pressures in the 200s. He arrived GCS 7T (2-1-4). He was intubated for airway protection on arrival. CBG 204.  Home Medications Prior to Admission medications   Medication Sig Start Date End Date Taking? Authorizing Provider  acetaminophen-codeine (TYLENOL #3) 300-30 MG tablet Take 1 tablet by mouth every 4 (four) hours as needed. 04/22/23   [provider]  atorvastatin (LIPITOR) 40 MG tablet Take 40 mg by mouth daily. 04/13/23   [provider]  chlorhexidine (PERIDEX) 0.12 % solution Use as directed 15 mLs in the mouth or throat in the morning, at noon, and at bedtime. 04/22/23   [provider]  clonazePAM (KLONOPIN) 0.5 MG tablet Take 0.5 mg by mouth 2 (two) times daily as needed. 04/12/23   [provider]  cloNIDine (CATAPRES) 0.1 MG tablet Take 0.1 mg by mouth 2 (two) times daily as needed. 04/12/23   [provider]  DULoxetine (CYMBALTA) 30 MG capsule Take 30 mg by mouth every morning. 05/01/23   [provider]  ELIQUIS 5 MG TABS tablet Take 5 mg by mouth 2 (two) times daily. 03/18/23   [provider]  fluticasone (FLONASE) 50 MCG/ACT nasal spray Place 2 sprays into both nostrils at bedtime. 04/28/23   [provider]  furosemide (LASIX) 20 MG tablet Take 20 mg by mouth daily as needed. 04/01/23   [provider]  furosemide (LASIX) 40 MG tablet Take 40 mg by mouth daily as needed. 04/08/23   [provider]  gabapentin (NEURONTIN) 800 MG tablet Take 800 mg by mouth 3 (three) times daily. 04/09/23   [provider]  HUMALOG KWIKPEN 100 UNIT/ML KwikPen Inject 2-10 Units into the skin as directed. 04/08/23   [provider]  hydrochlorothiazide (HYDRODIURIL) 12.5 MG tablet Take 12.5 mg by mouth daily. 04/23/23   [provider]  losartan (COZAAR) 100 MG tablet Take 100 mg by mouth daily. 04/20/23   [provider]  mirtazapine (REMERON) 45 MG tablet Take 45 mg by mouth at bedtime. 05/01/23   [provider]  oxyCODONE-acetaminophen (PERCOCET/ROXICET) 5-325 MG tablet Take 1 tablet by mouth every 6 (six) hours as needed. 04/19/23   [provider]  sildenafil (REVATIO) 20 MG tablet Take 20 mg by mouth as needed. 04/19/23   [provider]  TRULICITY 1.5 MG/0.5ML SOAJ Inject 1.5 mg into the skin once a week. 03/29/23   [provider]  Vitamin D, Ergocalciferol, (DRISDOL) 1.25 MG (50000 UNIT) CAPS capsule Take 50,000 Units by mouth once a week. 04/20/23   [provider]      Allergies    Patient has no allergy information on record.    Review of Systems   Review of Systems  Unable to perform ROS: Acuity of condition    Physical Exam Updated Vital Signs BP (!) 156/94 (BP Location: Right Arm)   Pulse (!) 110   Temp (!) 96.8 F (36 C) (Bladder)   Resp 14   Ht 6\' 2"  (1.88 m)   Wt 127 kg   SpO2 100%   BMI 35.95 kg/m  Physical Exam Vitals and nursing note reviewed.  Constitutional:      Appearance: He is obese.  HENT:     Head: Normocephalic.     Comments: Abrasion to the left forehead Eyes:     Conjunctiva/sclera: Conjunctivae normal.     Comments: Pupils appear to be asymetric and  sluggish  Cardiovascular:     Rate and Rhythm: Regular rhythm. Tachycardia present.  Pulmonary:     Comments: Igel in place, actively being bagged Abdominal:     General: There is distension.     Comments: Anasarca present  Musculoskeletal:        General: No deformity or signs of injury.     Cervical back: Neck supple.  Skin:    Findings: No lesion or rash.  Neurological:     Mental Status: He is alert.     Comments: GCS 7T, pupils appear asymmetric, withdraws to pain in the right hemibody     ED Results / Procedures / Treatments   Labs (all labs ordered are listed, but only abnormal results are displayed) Labs Reviewed  COMPREHENSIVE METABOLIC PANEL - Abnormal; Notable for the following components:      Result Value   Potassium 3.4 (*)    Glucose, Bld 219 (*)    Albumin 3.3 (*)    AST 142 (*)    ALT 111 (*)    Alkaline Phosphatase 150 (*)    All other components within normal limits  CBC - Abnormal; Notable for the following components:   Hemoglobin 12.0 (*)    HCT 38.8 (*)    All other components within normal limits  URINALYSIS, ROUTINE W REFLEX MICROSCOPIC - Abnormal; Notable for the following components:   Glucose, UA 150 (*)    Hgb urine dipstick SMALL (*)    Protein, ur 100 (*)    Bacteria, UA RARE (*)    All other components within normal limits  PROTIME-INR - Abnormal; Notable for the following components:   Prothrombin Time 15.8 (*)    All other components within normal limits  I-STAT CHEM 8, ED - Abnormal; Notable for the following components:   Potassium 3.4 (*)    Chloride 96 (*)    Glucose, Bld 232 (*)    Calcium, Ion 1.10 (*)    Hemoglobin 12.9 (*)    HCT 38.0 (*)    All other components within normal limits  I-STAT CG4 LACTIC ACID, ED - Abnormal; Notable for the following components:   Lactic Acid, Venous 3.0 (*)    All other components within normal limits  I-STAT ARTERIAL BLOOD GAS, ED - Abnormal; Notable for the following components:   pCO2  arterial 55.4 (*)    pO2, Arterial 201 (*)    Bicarbonate 34.6 (*)    TCO2 36 (*)    Acid-Base Excess 7.0 (*)    HCT 38.0 (*)    Hemoglobin 12.9 (*)    All other components within normal limits  CBG MONITORING, ED - Abnormal; Notable for the following components:   Glucose-Capillary 204 (*)    All other components within normal limits  ETHANOL  BRAIN NATRIURETIC  PEPTIDE  CDS SEROLOGY  TRIGLYCERIDES  BLOOD GAS, ARTERIAL  HEMOGLOBIN A1C  HIV ANTIBODY (ROUTINE TESTING W REFLEX)  CBC  BASIC METABOLIC PANEL  MAGNESIUM  PHOSPHORUS  BLOOD GAS, ARTERIAL  SAMPLE TO BLOOD BANK  TROPONIN I (HIGH SENSITIVITY)  TROPONIN I (HIGH SENSITIVITY)    EKG EKG Interpretation Date/Time:  Monday May 06 2023 17:23:32 EST Ventricular Rate:  110 PR Interval:  169 QRS Duration:  91 QT Interval:  354 QTC Calculation: 479 R Axis:   40  Text Interpretation: Sinus tachycardia Borderline T abnormalities, inferior leads Borderline prolonged QT interval Confirmed by Ernie Avena ([id number]) on 05/06/2023 5:34:55 PM  Radiology CT ANGIO HEAD NECK W WO CM Result Date: 05/06/2023 CLINICAL DATA:  Cervical spine fracture with concern for arterial injury EXAM: CT ANGIOGRAPHY HEAD AND NECK WITH AND WITHOUT CONTRAST TECHNIQUE: Multidetector CT imaging of the head and neck was performed using the standard protocol during bolus administration of intravenous contrast. Multiplanar CT image reconstructions and MIPs were obtained to evaluate the vascular anatomy. Carotid stenosis measurements (when applicable) are obtained utilizing NASCET criteria, using the distal internal carotid diameter as the denominator. RADIATION DOSE REDUCTION: This exam was performed according to the departmental dose-optimization program which includes automated exposure control, adjustment of the mA and/or kV according to patient size and/or use of iterative reconstruction technique. CONTRAST:  75mL OMNIPAQUE IOHEXOL 350 MG/ML SOLN COMPARISON:   No prior CTA available, correlation is made with 05/06/2023 CT head FINDINGS: CT HEAD FINDINGS Brain: Increasing hyperdensity along the posterior falx and tentorium, likely trace subdural hemorrhage, which was not notable on the prior exam. No evidence of acute infarct, hemorrhage, mass, mass effect, or midline shift. No hydrocephalus. Vascular: No hyperdense vessel. Skull: Negative for fracture or focal lesion. Right frontal scalp hematoma. Could Sinuses/Orbits: Mucosal thickening in the right greater than left maxillary sinus, ethmoid air cells, and right greater than left frontal sinuses. Other: The mastoid air cells are well aerated. CTA NECK FINDINGS Aortic arch: Two-vessel arch with a common origin of the brachiocephalic and left common carotid arteries. Imaged portion shows no evidence of aneurysm or dissection. No significant stenosis of the major arch vessel origins. Right carotid system: No evidence of dissection, occlusion, or hemodynamically significant stenosis (greater than 50%). Left carotid system: No evidence of dissection, occlusion, or hemodynamically significant stenosis (greater than 50%). Vertebral arteries: The left vertebral artery is diminutive throughout its course. No definite abnormality is seen in the left vertebral artery as it passes by the fracture of the left C1 vertebral artery foramen (series 10, image 170). The right vertebral artery is dominant and is patent from its origin to the skull base, without evidence of dissection or hemodynamically significant stenosis. Skeleton: For detailed osseous findings, please see same-day CT head and cervical spine. Compared to the recent CT cervical spine, there is now increased anterior displacement of the dens relative to the body of C2, measuring approximately 4 mm (series 12, image 123), previously up to 1 mm. Other neck: No acute finding. Upper chest: For findings in the thorax, please see same day CT chest. Review of the MIP images  confirms the above findings CTA HEAD FINDINGS Anterior circulation: Both internal carotid arteries are patent to the termini, with calcifications but without significant stenosis. A1 segments patent. Normal anterior communicating artery. Anterior cerebral arteries are patent to their distal aspects without significant stenosis. No M1 stenosis or occlusion. MCA branches perfused to their distal aspects without significant stenosis. Posterior circulation: Vertebral arteries patent  to the vertebrobasilar junction, with possible severe stenosis distal left V4 (series 10, image 136). Basilar patent to its distal aspect without significant stenosis. Superior cerebellar arteries patent proximally. Patent P1 segments. Severe stenosis in the left P2 (series 12, image 139). PCAs otherwise perfused to their distal aspects without significant stenosis. The bilateral posterior communicating arteries are not visualized. Venous sinuses: As permitted by contrast timing, patent. Anatomic variants: None significant. No evidence of aneurysm or vascular malformation. Review of the MIP images confirms the above findings IMPRESSION: 1. Increasing hyperdensity along the posterior falx and tentorium, likely trace subdural hemorrhage, which is new from the prior exam. 2. The left vertebral artery is diminutive throughout its course. No definite acute abnormality is seen in the left vertebral artery as it passes by the fracture of the left C1 vertebral artery foramen. 3. Compared to the recent CT cervical spine, there is now increased anterior displacement of the dens relative to the body of C2, measuring approximately 4 mm, previously up to 1 mm. 4. Possible severe stenosis in the distal left V4 and left P2. Imaging results were communicated on 05/06/2023 at 6:33 pm to provider [facility] via secure text paging. Electronically Signed   By: Wiliam Ke M.D.   On: 05/06/2023 18:33   DG Chest Port 1 View Result Date: 05/06/2023 CLINICAL  DATA:  Pain after trauma EXAM: PORTABLE CHEST 1 VIEW COMPARISON:  X-ray 04/15/2023 FINDINGS: ET tube in place with tip 3 cm above the carina proximally. Enteric tube in place with tip extending beneath the diaphragm. Underinflation with widened mediastinum. Enlarged cardiopericardial silhouette. Vascular congestion. No pneumothorax or effusion. Overlapping cardiac leads. Left shoulder arthroplasty seen at the edge of the imaging field. IMPRESSION: New ET tube and enteric tube. Underinflation with enlarged cardiopericardial silhouette with vascular congestion widened mediastinum. With changes recommend further workup with contrast CT when clinically appropriate Electronically Signed   By: Karen Kays M.D.   On: 05/06/2023 18:26   CT Angio Chest PE W and/or Wo Contrast Result Date: 05/06/2023 CLINICAL DATA:  Cardiac arrest. Blunt abdominal trauma. Cervical spine fracture. EXAM: CT ANGIOGRAPHY CHEST CT ABDOMEN AND PELVIS WITH CONTRAST TECHNIQUE: Multidetector CT imaging of the chest was performed using the standard protocol during bolus administration of intravenous contrast. Multiplanar CT image reconstructions and MIPs were obtained to evaluate the vascular anatomy. Multidetector CT imaging of the abdomen and pelvis was performed using the standard protocol during bolus administration of intravenous contrast. RADIATION DOSE REDUCTION: This exam was performed according to the departmental dose-optimization program which includes automated exposure control, adjustment of the mA and/or kV according to patient size and/or use of iterative reconstruction technique. CONTRAST:  90mL Omnipaque 350 for CT chest 75 cc Omnipaque 354 CT abdomen/pelvis performed about 70 minutes after the CT chest COMPARISON:  Chest radiograph 05/06/2023, CT abdomen 12/19/2018 FINDINGS: CTA CHEST FINDINGS Cardiovascular: Atherosclerotic calcification of the aortic arch. Moderate cardiomegaly. No filling defect is identified in the pulmonary  arterial tree to suggest pulmonary embolus. Mediastinum/Nodes: Mild thyromegaly without focal nodule identified. Lungs/Pleura: Endotracheal tube satisfactorily positioned. Patchy bandlike airspace opacities in both lower lobes mostly from atelectasis, aspiration pneumonitis not excluded. There is also some dependent airspace opacity in the right upper lobe and bandlike atelectasis in the apicoposterior segment left upper lobe. Musculoskeletal: Degenerative glenohumeral arthropathy on the right with left shoulder arthroplasty. Thoracic spondylosis. Review of the MIP images confirms the above findings. CT ABDOMEN and PELVIS FINDINGS Hepatobiliary: Unremarkable Pancreas: Unremarkable Spleen: Absent.  Minimal regenerative splenic nodularity.  Adrenals/Urinary Tract: Foley catheter satisfactorily positioned in the urinary bladder. Kidneys unremarkable. Stomach/Bowel: Nasogastric tube tip in the stomach body. Mild thickening of portions of the appendix up to 1.0 cm in diameter without surrounding inflammatory findings. Strictly speaking, mild appendicitis is not excluded, and appendiceal mass could also have this appearance. Vascular/Lymphatic: IVC filter noted with very small caliber IVC and with substantial venous collateral structures in the abdomen and subcutaneous tissues as before. Mild aortoiliac atheromatous vascular calcifications. Reproductive: Unremarkable Other: No supplemental non-categorized findings. Musculoskeletal: Remote superior endplate compressions at L1 and L2, no change from 12/19/2018. Review of the MIP images confirms the above findings. IMPRESSION: 1. No filling defect is identified in the pulmonary arterial tree to suggest pulmonary embolus. 2. Patchy bandlike airspace opacities in both lower lobes mostly from atelectasis, aspiration pneumonitis not excluded. There is also some dependent airspace opacity in the right upper lobe and bandlike atelectasis in the apicoposterior segment left upper  lobe. 3. Moderate cardiomegaly. 4. IVC filter noted with very small caliber IVC and with substantial venous collateral structures in the abdomen and subcutaneous tissues as before. 5. Mild thickening of portions of the appendix up to 1.0 cm in diameter without surrounding inflammatory findings. Strictly speaking, mild appendicitis is not excluded, and appendiceal mass could also have this appearance (although no appendiceal mass was visible on the CT from 12/19/2018). 6. Remote superior endplate compressions at L1 and L2, no change from 12/19/2018. 7. Aortic atherosclerosis. Aortic Atherosclerosis (ICD10-I70.0). Electronically Signed   By: Gaylyn Rong M.D.   On: 05/06/2023 18:25   CT ABDOMEN PELVIS W CONTRAST Result Date: 05/06/2023 CLINICAL DATA:  Cardiac arrest. Blunt abdominal trauma. Cervical spine fracture. EXAM: CT ANGIOGRAPHY CHEST CT ABDOMEN AND PELVIS WITH CONTRAST TECHNIQUE: Multidetector CT imaging of the chest was performed using the standard protocol during bolus administration of intravenous contrast. Multiplanar CT image reconstructions and MIPs were obtained to evaluate the vascular anatomy. Multidetector CT imaging of the abdomen and pelvis was performed using the standard protocol during bolus administration of intravenous contrast. RADIATION DOSE REDUCTION: This exam was performed according to the departmental dose-optimization program which includes automated exposure control, adjustment of the mA and/or kV according to patient size and/or use of iterative reconstruction technique. CONTRAST:  90mL Omnipaque 350 for CT chest 75 cc Omnipaque 354 CT abdomen/pelvis performed about 70 minutes after the CT chest COMPARISON:  Chest radiograph 05/06/2023, CT abdomen 12/19/2018 FINDINGS: CTA CHEST FINDINGS Cardiovascular: Atherosclerotic calcification of the aortic arch. Moderate cardiomegaly. No filling defect is identified in the pulmonary arterial tree to suggest pulmonary embolus.  Mediastinum/Nodes: Mild thyromegaly without focal nodule identified. Lungs/Pleura: Endotracheal tube satisfactorily positioned. Patchy bandlike airspace opacities in both lower lobes mostly from atelectasis, aspiration pneumonitis not excluded. There is also some dependent airspace opacity in the right upper lobe and bandlike atelectasis in the apicoposterior segment left upper lobe. Musculoskeletal: Degenerative glenohumeral arthropathy on the right with left shoulder arthroplasty. Thoracic spondylosis. Review of the MIP images confirms the above findings. CT ABDOMEN and PELVIS FINDINGS Hepatobiliary: Unremarkable Pancreas: Unremarkable Spleen: Absent.  Minimal regenerative splenic nodularity. Adrenals/Urinary Tract: Foley catheter satisfactorily positioned in the urinary bladder. Kidneys unremarkable. Stomach/Bowel: Nasogastric tube tip in the stomach body. Mild thickening of portions of the appendix up to 1.0 cm in diameter without surrounding inflammatory findings. Strictly speaking, mild appendicitis is not excluded, and appendiceal mass could also have this appearance. Vascular/Lymphatic: IVC filter noted with very small caliber IVC and with substantial venous collateral structures in the abdomen and  subcutaneous tissues as before. Mild aortoiliac atheromatous vascular calcifications. Reproductive: Unremarkable Other: No supplemental non-categorized findings. Musculoskeletal: Remote superior endplate compressions at L1 and L2, no change from 12/19/2018. Review of the MIP images confirms the above findings. IMPRESSION: 1. No filling defect is identified in the pulmonary arterial tree to suggest pulmonary embolus. 2. Patchy bandlike airspace opacities in both lower lobes mostly from atelectasis, aspiration pneumonitis not excluded. There is also some dependent airspace opacity in the right upper lobe and bandlike atelectasis in the apicoposterior segment left upper lobe. 3. Moderate cardiomegaly. 4. IVC filter  noted with very small caliber IVC and with substantial venous collateral structures in the abdomen and subcutaneous tissues as before. 5. Mild thickening of portions of the appendix up to 1.0 cm in diameter without surrounding inflammatory findings. Strictly speaking, mild appendicitis is not excluded, and appendiceal mass could also have this appearance (although no appendiceal mass was visible on the CT from 12/19/2018). 6. Remote superior endplate compressions at L1 and L2, no change from 12/19/2018. 7. Aortic atherosclerosis. Aortic Atherosclerosis (ICD10-I70.0). Electronically Signed   By: Gaylyn Rong M.D.   On: 05/06/2023 18:25   CT HEAD WO CONTRAST Result Date: 05/06/2023 CLINICAL DATA:  Cardiac arrest, collapse with head strike EXAM: CT HEAD WITHOUT CONTRAST CT CERVICAL SPINE WITHOUT CONTRAST TECHNIQUE: Multidetector CT imaging of the head and cervical spine was performed following the standard protocol without intravenous contrast. Multiplanar CT image reconstructions of the cervical spine were also generated. RADIATION DOSE REDUCTION: This exam was performed according to the departmental dose-optimization program which includes automated exposure control, adjustment of the mA and/or kV according to patient size and/or use of iterative reconstruction technique. COMPARISON:  03/17/2023 CTA head and neck FINDINGS: CT HEAD FINDINGS Brain: No evidence of acute infarct, hemorrhage, mass, mass effect, or midline shift. No hydrocephalus or extra-axial fluid collection. Vascular: No hyperdense vessel. Skull: Negative for calvarial fracture or focal lesion. Right frontal scalp hematoma. Fracture of the bilateral nasal bones. Possible fracture of the osseous nasal septum. Sinuses/Orbits: Partial opacification of the right frontal sinus and ethmoid air cells. No acute finding in the orbits. Other: The mastoid air cells are well aerated. CT CERVICAL SPINE FINDINGS Alignment: No traumatic listhesis. Relative  to the [id number] cm, there is widening of the facets at C1-C2, left greater than right and at C2-C3. Skull base and vertebrae: Acute fracture through the base of the dens (series 10, image 28 and series 9, image 53), with approximately 4 mm of distraction. Additional multifocal fractures of the anterior and posterior arch of C1 (series 3, images 26-29 and series 9, images 42, 47, and 68), including a fracture involving the left C1 vertebral artery foramen (series 3, image 31). Soft tissues and spinal canal: Suspect prevertebral edema and epidural hemorrhage at C1-C2. Endotracheal and orogastric tubes are noted. Disc levels: Degenerative changes in the cervical spine.No high-grade spinal canal stenosis. Upper chest: For findings in the thorax, please see same day CT chest. IMPRESSION: 1. Acute fracture through the base of the dens, with approximately 4 mm of distraction. Additional multifocal fractures of the anterior and posterior arch of C1, including a fracture involving the left C1 vertebral artery foramen. CTA of the neck is recommended to evaluate for vertebral artery injury. 2. Widening of the facets at C1-C2, left greater than right, and at C2-C3, concerning for ligamentous injury. 3. Suspect prevertebral edema and epidural hemorrhage at C1-C2. 4. Fracture of the bilateral nasal bones. Possible fracture of the osseous nasal septum.  5. No acute intracranial process. These results were called by telephone at the time of interpretation on 05/06/2023 at 5:36 pm to provider [facility] , who verbally acknowledged these results. Electronically Signed   By: Wiliam Ke M.D.   On: 05/06/2023 17:36   CT CERVICAL SPINE WO CONTRAST Result Date: 05/06/2023 CLINICAL DATA:  Cardiac arrest, collapse with head strike EXAM: CT HEAD WITHOUT CONTRAST CT CERVICAL SPINE WITHOUT CONTRAST TECHNIQUE: Multidetector CT imaging of the head and cervical spine was performed following the standard protocol without intravenous  contrast. Multiplanar CT image reconstructions of the cervical spine were also generated. RADIATION DOSE REDUCTION: This exam was performed according to the departmental dose-optimization program which includes automated exposure control, adjustment of the mA and/or kV according to patient size and/or use of iterative reconstruction technique. COMPARISON:  03/17/2023 CTA head and neck FINDINGS: CT HEAD FINDINGS Brain: No evidence of acute infarct, hemorrhage, mass, mass effect, or midline shift. No hydrocephalus or extra-axial fluid collection. Vascular: No hyperdense vessel. Skull: Negative for calvarial fracture or focal lesion. Right frontal scalp hematoma. Fracture of the bilateral nasal bones. Possible fracture of the osseous nasal septum. Sinuses/Orbits: Partial opacification of the right frontal sinus and ethmoid air cells. No acute finding in the orbits. Other: The mastoid air cells are well aerated. CT CERVICAL SPINE FINDINGS Alignment: No traumatic listhesis. Relative to the [id number] cm, there is widening of the facets at C1-C2, left greater than right and at C2-C3. Skull base and vertebrae: Acute fracture through the base of the dens (series 10, image 28 and series 9, image 53), with approximately 4 mm of distraction. Additional multifocal fractures of the anterior and posterior arch of C1 (series 3, images 26-29 and series 9, images 42, 47, and 68), including a fracture involving the left C1 vertebral artery foramen (series 3, image 31). Soft tissues and spinal canal: Suspect prevertebral edema and epidural hemorrhage at C1-C2. Endotracheal and orogastric tubes are noted. Disc levels: Degenerative changes in the cervical spine.No high-grade spinal canal stenosis. Upper chest: For findings in the thorax, please see same day CT chest. IMPRESSION: 1. Acute fracture through the base of the dens, with approximately 4 mm of distraction. Additional multifocal fractures of the anterior and posterior arch of C1,  including a fracture involving the left C1 vertebral artery foramen. CTA of the neck is recommended to evaluate for vertebral artery injury. 2. Widening of the facets at C1-C2, left greater than right, and at C2-C3, concerning for ligamentous injury. 3. Suspect prevertebral edema and epidural hemorrhage at C1-C2. 4. Fracture of the bilateral nasal bones. Possible fracture of the osseous nasal septum. 5. No acute intracranial process. These results were called by telephone at the time of interpretation on 05/06/2023 at 5:36 pm to provider [facility] , who verbally acknowledged these results. Electronically Signed   By: Wiliam Ke M.D.   On: 05/06/2023 17:36    Procedures .Critical Care  Performed by: Ernie Avena, MD Authorized by: Ernie Avena, MD   Critical care provider statement:    Critical care time (minutes):  55   Critical care was necessary to treat or prevent imminent or life-threatening deterioration of the following conditions:  Respiratory failure and CNS failure or compromise   Critical care was time spent personally by me on the following activities:  Development of treatment plan with patient or surrogate, discussions with consultants, evaluation of patient's response to treatment, examination of patient, ordering and review of laboratory studies, ordering and review of radiographic  studies, ordering and performing treatments and interventions, pulse oximetry, re-evaluation of patient's condition and review of old charts   Care discussed with: admitting provider   Procedure Name: Intubation Date/Time: 05/06/2023 4:36 PM  Performed by: Ernie Avena, MDPre-anesthesia Checklist: Patient identified, Patient being monitored, Emergency Drugs available, Timeout performed and Suction available Oxygen Delivery Method: Non-rebreather mask Preoxygenation: Pre-oxygenation with 100% oxygen Induction Type: Rapid sequence Ventilation: Mask ventilation without difficulty Laryngoscope  Size: Glidescope and 3 Grade View: Grade III Tube size: 7.5 mm Placement Confirmation: ETT inserted through vocal cords under direct vision, CO2 detector and Breath sounds checked- equal and bilateral Secured at: 25 cm Tube secured with: ETT holder        Medications Ordered in ED Medications  fentaNYL in NS (26mcg/ml) infusion-PREMIX (200 mcg/hr Intravenous Rate/Dose Verify 05/06/23 1823)  fentaNYL (SUBLIMAZE) bolus via infusion 50-100 mcg (has no administration in time range)  propofol (DIPRIVAN) 1000 MG/100ML infusion (40 mcg/kg/min  127 kg Intravenous Rate/Dose Verify 05/06/23 1823)  polyethylene glycol (MIRALAX / GLYCOLAX) packet 17 g (has no administration in time range)  docusate (COLACE) 50 MG/5ML liquid 100 mg (has no administration in time range)  polyethylene glycol (MIRALAX / GLYCOLAX) packet 17 g (17 g Per Tube Not Given 05/06/23 1850)  insulin aspart (novoLOG) injection 0-15 Units (has no administration in time range)  famotidine (PEPCID) tablet 20 mg (has no administration in time range)  ondansetron (ZOFRAN) injection 4 mg (has no administration in time range)  acetaminophen (TYLENOL) tablet 650 mg (has no administration in time range)    Or  acetaminophen (TYLENOL) 160 MG/5ML solution 650 mg (has no administration in time range)    Or  acetaminophen (TYLENOL) suppository 650 mg (has no administration in time range)  busPIRone (BUSPAR) tablet 30 mg (has no administration in time range)  magnesium sulfate IVPB 2 g 50 mL (has no administration in time range)  norepinephrine (LEVOPHED) 4mg  in (0.016 mg/mL) premix infusion (0 mcg/min Intravenous Hold 05/06/23 1850)  0.9 %  sodium chloride infusion (250 mLs Intravenous Not Given 05/06/23 1851)  fentaNYL (SUBLIMAZE) injection 50 mcg (50 mcg Intravenous Not Given 05/06/23 1849)  etomidate (AMIDATE) injection (20 mg Intravenous Given 05/06/23 1614)  succinylcholine (ANECTINE) injection (200 mg  Intravenous Given 05/06/23 1615)  potassium chloride 10 mEq in 100 mL IVPB (has no administration in time range)  fentaNYL (SUBLIMAZE) injection 50 mcg (50 mcg Intravenous Given 05/06/23 1622)  iohexol (OMNIPAQUE) 350 MG/ML injection 90 mL (90 mLs Intravenous Contrast Given 05/06/23 1701)  iohexol (OMNIPAQUE) 350 MG/ML injection 75 mL (75 mLs Intravenous Contrast Given 05/06/23 1809)    ED Course/ Medical Decision Making/ A&P                                 Medical Decision Making Amount and/or Complexity of Data Reviewed Labs: ordered. Radiology: ordered.  Risk Prescription drug management. Decision regarding hospitalization.   57 year old male with medical history significant for hypertension, prior , OSA, diabetes mellitus on insulin, CVA, TBI with left hemiparesis, CKD, DVT and PE, depression presenting to the emergency department status post cardiac arrest.  Per EMS, the patient was leaving a barbershop and collapsed outside.  He struck his head and arrives with a c-collar in place, was being bagged through an Igel.  Rhythm noted for 2 rounds of CPR was PEA.  Subsequent ROSC with pressures in the 200s. He arrived GCS 7T (2-1-4). He  was intubated for airway protection on arrival. CBG 204.  On arrival, the patient was afebrile, temperature 95.9, tachycardic heart rate 111, actively being bagged with an Igel in place, GCS 7 (2-1-4). Pt was intubated on arrival for airway protection.  Concern for PE, ACS, acute traumatic injury, CVA/ICH.  Twelve-lead with EMS did not show STEMI and this was confirmed on initial EKG, sinus tachycardia, ventricular rate 110, borderline T wave changes, borderline prolonged QT but otherwise no evidence of acute ischemic changes or abnormal intervals.  Postintubation chest x-ray revealed ET tube in good position, patient with bilateral breath sounds postintubation, no complications from intubation, C-spine was maintained through intubation, no hypoxia, patient  hemodynamically stable.  He was started on propofol and fentanyl for sedation.  CT Head and Cervical Spine: IMPRESSION: 1. Acute fracture through the base of the dens, with approximately 4 mm of distraction. Additional multifocal fractures of the anterior and posterior arch of C1, including a fracture involving the left C1 vertebral artery foramen. CTA of the neck is recommended to evaluate for vertebral artery injury. 2. Widening of the facets at C1-C2, left greater than right, and at C2-C3, concerning for ligamentous injury. 3. Suspect prevertebral edema and epidural hemorrhage at C1-C2. 4. Fracture of the bilateral nasal bones. Possible fracture of the osseous nasal septum. 5. No acute intracranial process.   Patient activated as a level 1 trauma, neurosurgery consulted.  Labs: ABG revealed a normal pH at 7.40, pCO2 55, HCO3 34.6, lactic elevated at 3.0, BNP normal, ethanol level normal, troponin initially 7, CMP with elevated LFTs.  The patient has known LFT elevations on chart review.  Dr. Danielle Dess of neurosurgery on-call reviewed the patient's CT imaging and did not see findings that would require emergent surgery at this time, recommended hard collar in place.  Trauma surgery recommended pulmonary critical care for admission for ventilator management.    CTA PE:  IMPRESSION:  1. No filling defect is identified in the pulmonary arterial tree to  suggest pulmonary embolus.  2. Patchy bandlike airspace opacities in both lower lobes mostly  from atelectasis, aspiration pneumonitis not excluded. There is also  some dependent airspace opacity in the right upper lobe and bandlike  atelectasis in the apicoposterior segment left upper lobe.  3. Moderate cardiomegaly.  4. IVC filter noted with very small caliber IVC and with substantial  venous collateral structures in the abdomen and subcutaneous tissues  as before.  5. Mild thickening of portions of the appendix up to 1.0 cm in   diameter without surrounding inflammatory findings. Strictly  speaking, mild appendicitis is not excluded, and appendiceal mass  could also have this appearance (although no appendiceal mass was  visible on the CT from 12/19/2018).  6. Remote superior endplate compressions at L1 and L2, no change  from 12/19/2018.    CTA Head and Neck: IMPRESSION:  1. Increasing hyperdensity along the posterior falx and tentorium,  likely trace subdural hemorrhage, which is new from the prior exam.  2. The left vertebral artery is diminutive throughout its course. No  definite acute abnormality is seen in the left vertebral artery as  it passes by the fracture of the left C1 vertebral artery foramen.  3. Compared to the recent CT cervical spine, there is now increased  anterior displacement of the dens relative to the body of C2,  measuring approximately 4 mm, previously up to 1 mm.  4. Possible severe stenosis in the distal left V4 and left P2.  Imaging results were communicated on 05/06/2023 at 6:33 pm to  provider [facility] via secure text paging.   Pulmonary critical care consulted and the patient was admitted in critical but stabilized condition, family were updated in the consult room regarding findings.   Final Clinical Impression(s) / ED Diagnoses Final diagnoses:  Cardiac arrest (HCC)  Closed odontoid fracture, initial encounter (HCC)  Closed nondisplaced fracture of first cervical vertebra, unspecified fracture morphology, initial encounter (HCC)  Closed fracture of nasal bone, initial encounter  SDH (subdural hematoma) (HCC)    Rx / DC Orders ED Discharge Orders     None         Ernie Avena, MD 05/06/23 [place] Moment

## 2023-05-06 NOTE — Consult Note (Signed)
Surgical Evaluation Requesting provider: Ernie Avena MD  Chief Complaint: arrest and fall  HPI: 57 year old male with significant medical history including obesity, hypertension, obstructive sleep apnea, diabetes mellitus on insulin, prior CVA, TBI with left hemiparesis, chronic kidney disease, DVT and PE, depression who was brought in by EMS for cardiac arrest initially.  History is obtained from chart review and discussion with other providers as the patient is intubated and unable to dissipate.  He was apparently leaving a barbershop when he collapsed outside and hit his head.  He did arrive in a c-collar with an I-gel airway and was noted to have PEA requiring 2 rounds of CPR with return of spontaneous circulation and subsequent severe hypertension.  He was intubated on arrival to the emergency department for airway protection.  He initially arrived just after 4 PM.  A level 1 trauma alert was paged out at 5:41 PM when initial imaging demonstrated a C1-C2 fracture.   Unable to confirm allergies, past medical/surgical/social/family histories or medication list at this time due to acuity and altered patient mental status.  No current facility-administered medications on file prior to encounter.   Current Outpatient Medications on File Prior to Encounter  Medication Sig Dispense Refill   acetaminophen-codeine (TYLENOL #3) 300-30 MG tablet Take 1 tablet by mouth every 4 (four) hours as needed.     atorvastatin (LIPITOR) 40 MG tablet Take 40 mg by mouth daily.     chlorhexidine (PERIDEX) 0.12 % solution Use as directed 15 mLs in the mouth or throat in the morning, at noon, and at bedtime.     clonazePAM (KLONOPIN) 0.5 MG tablet Take 0.5 mg by mouth 2 (two) times daily as needed.     cloNIDine (CATAPRES) 0.1 MG tablet Take 0.1 mg by mouth 2 (two) times daily as needed.     DULoxetine (CYMBALTA) 30 MG capsule Take 30 mg by mouth every morning.     ELIQUIS 5 MG TABS tablet Take 5 mg by mouth 2 (two)  times daily.     fluticasone (FLONASE) 50 MCG/ACT nasal spray Place 2 sprays into both nostrils at bedtime.     furosemide (LASIX) 20 MG tablet Take 20 mg by mouth daily as needed.     furosemide (LASIX) 40 MG tablet Take 40 mg by mouth daily as needed.     gabapentin (NEURONTIN) 800 MG tablet Take 800 mg by mouth 3 (three) times daily.     HUMALOG KWIKPEN 100 UNIT/ML KwikPen Inject 2-10 Units into the skin as directed.     hydrochlorothiazide (HYDRODIURIL) 12.5 MG tablet Take 12.5 mg by mouth daily.     losartan (COZAAR) 100 MG tablet Take 100 mg by mouth daily.     mirtazapine (REMERON) 45 MG tablet Take 45 mg by mouth at bedtime.     oxyCODONE-acetaminophen (PERCOCET/ROXICET) 5-325 MG tablet Take 1 tablet by mouth every 6 (six) hours as needed.     sildenafil (REVATIO) 20 MG tablet Take 20 mg by mouth as needed.     TRULICITY 1.5 MG/0.5ML SOAJ Inject 1.5 mg into the skin once a week.     Vitamin D, Ergocalciferol, (DRISDOL) 1.25 MG (50000 UNIT) CAPS capsule Take 50,000 Units by mouth once a week.      Review of Systems: a complete, 10pt review of systems was unable to be completed due to patient mental status  Physical Exam: Vitals:   05/06/23 1719  BP: (!) 172/117  Pulse: (!) 111  Resp: 16  Temp: (!) 95.9  F (35.5 C)   Gen: Intubated and sedated Eyes: Pupils asymmetric Neck: supple without mass or thyromegaly Chest: On vent,  Cardiovascular: RRR, hypertensive Gastrointestinal: Obese, soft.  Midline laparotomy scar and vague left lower quadrant scar of unknown etiology.  No palpable mass, hepatomegaly or splenomegaly. . Muscoloskeletal: Unable to assess strength or range of motion as patient is intubated and sedated.  Bilateral lower extremity edema with chronic venous stasis changes present Neuro: GCS 7 T-withdraws to pain on the right. Skin: warm and dry      Latest Ref Rng & Units 05/06/2023    5:26 PM 05/06/2023    4:32 PM 05/06/2023    4:21 PM  CBC  WBC 4.0 - 10.5  K/uL   10.1   Hemoglobin 13.0 - 17.0 g/dL 09.8  11.9  14.7   Hematocrit 39.0 - 52.0 % 38.0  38.0  38.8   Platelets 150 - 400 K/uL   360        Latest Ref Rng & Units 05/06/2023    5:26 PM 05/06/2023    4:32 PM 05/06/2023    4:21 PM  CMP  Glucose 70 - 99 mg/dL  829  562   BUN 6 - 20 mg/dL  14  12   Creatinine 1.30 - 1.24 mg/dL  8.65  7.84   Sodium 696 - 145 mmol/L 135  138  137   Potassium 3.5 - 5.1 mmol/L 3.5  3.4  3.4   Chloride 98 - 111 mmol/L  96  98   CO2 22 - 32 mmol/L   28   Calcium 8.9 - 10.3 mg/dL   8.9   Total Protein 6.5 - 8.1 g/dL   7.8   Total Bilirubin <1.2 mg/dL   0.8   Alkaline Phos 38 - 126 U/L   150   AST 15 - 41 U/L   142   ALT 0 - 44 U/L   111     Lab Results  Component Value Date   INR 1.2 05/06/2023    Imaging: No results found.   A/P: 57 year old male with multiple significant medical problems who presents postcardiac arrest which was complicated by a fall.  -Acute C1 and C2 fracture including left C1 vertebral artery foramen and suspected associated epidural hemorrhage at C1-C2. Concern for ligamentous injury; Dr. Danielle Henderson has been consulted and preliminary recommendations for hard collar.  CTA negative for vertebral dissection but notes new trace SDH on falx and worsened displacement of dens- defer to neurosurgery; would discuss with them before starting any anticoagulation for this patient  -Bilateral nasal bone fractures and possible fracture of the osseous nasal septum-recommend face consult when clinically stabilized  Additional imaging findings- -Cardiomegaly,  -IVC filter in place with very small caliber IVC and with substantial venous collateral structures in the abdomen and subcutaneous tissues as before. - Mild thickening of portions of the appendix up to 1.0 cm in diameter without surrounding inflammatory findings. Strictly speaking, mild appendicitis is not excluded, and appendiceal mass could also have this appearance - rec outpatient  followup - Remote superior endplate compressions at L1 and L2, no change from 12/19/2018 -Aortic atherosclerosis -Possible severe stenosis in the distal left V4 and left P   Patient being admitted to [facility] for further workup and management of his cardiac arrest.  Trauma service will follow for tertiary survey.  Patient Active Problem List   Diagnosis Date Noted   Cardiac arrest [facility]) 05/06/2023       Phylliss Blakes, MD  [facility]  See AMION to contact appropriate on-call provider

## 2023-05-07 DIAGNOSIS — W19XXXA Unspecified fall, initial encounter: Secondary | ICD-10-CM

## 2023-05-07 DIAGNOSIS — I469 Cardiac arrest, cause unspecified: Secondary | ICD-10-CM

## 2023-05-07 DIAGNOSIS — S022XXA Fracture of nasal bones, initial encounter for closed fracture: Secondary | ICD-10-CM

## 2023-05-07 MED ORDER — VITAL 1.5 CAL PO LIQD
1000.0000 mL | ORAL | Status: DC
Start: 2023-05-07 — End: 2023-05-21

## 2023-05-07 MED ORDER — SODIUM CHLORIDE 0.9 % IV SOLN
250.0000 mL | INTRAVENOUS | Status: AC
Start: 2023-05-07 — End: 2023-05-08

## 2023-05-07 NOTE — Progress Notes (Signed)
Transition of Care [facility]) - CAGE-AID Screening   Patient Details  Name: Glenn Henderson MRN: [id number] Date of Birth: 22-Jun-1965  Transition of Care [facility] [place]) CM/SW Contact:    Leota Sauers, RN Phone Number: 05/07/2023, 7:44 PM   Clinical Narrative:  Patient unable to participate in screening, patient currently intubated.  CAGE-AID Screening: Substance Abuse Screening unable to be completed due to: : Patient unable to participate (Patient intubated)

## 2023-05-07 NOTE — Procedures (Signed)
Patient Name: Glenn Henderson  MRN: [id number]  Epilepsy Attending: Charlsie Quest  Referring Physician/Provider: Cheri Fowler, MD  Date: 05/07/2023 Duration: 24.03 mins  Patient history: 57yo M s/p cardiac arrest getting eeg to evaluate for seizure  Level of alertness: comatose  AEDs during EEG study: Propofol  Technical aspects: This EEG study was done with scalp electrodes positioned according to the 10-20 International system of electrode placement. Electrical activity was reviewed with band pass filter of 1-70Hz , sensitivity of 7 uV/mm, display speed of 30mm/sec with a 60Hz  notched filter applied as appropriate. EEG data were recorded continuously and digitally stored.  Video monitoring was available and reviewed as appropriate.  Description: EEG showed continuous generalized 3 to 6 Hz theta-delta slowing admixed with 12-13 Hz beta activity distributed symmetrically and diffusely. Hyperventilation and photic stimulation were not performed.     ABNORMALITY - Continuous slow, generalized  IMPRESSION: This study is suggestive of severe diffuse encephalopathy likely related to sedation. No seizures or epileptiform discharges were seen throughout the recording.  Jeremias Broyhill Annabelle Harman

## 2023-05-07 NOTE — Progress Notes (Signed)
Echocardiogram 2D Echocardiogram has been performed.  Glenn Henderson 05/07/2023, 3:40 PM

## 2023-05-07 NOTE — Progress Notes (Signed)
Trauma/Critical Care Follow Up Note  Subjective:    Overnight Issues:   Objective:  Vital signs for last 24 hours: Temp:  [95.9 F (35.5 C)-99.9 F (37.7 C)] 98.4 F (36.9 C) (12/17 0800) Pulse Rate:  [70-111] 70 (12/17 0700) Resp:  [13-21] 14 (12/17 0700) BP: (108-172)/(69-117) 161/100 (12/17 0700) SpO2:  [96 %-100 %] 99 % (12/17 0700) FiO2 (%):  [40 %-100 %] 40 % (12/17 0743) Weight:  [127 kg-132.9 kg] 132.9 kg (12/17 0500)  Hemodynamic parameters for last 24 hours:    Intake/Output from previous day: 12/16 0701 - 12/17 0700 In: 1360 [I.V.:700; NG/GT:60; IV Piggyback:600] Out: 1840 [Urine:1840]  Intake/Output this shift: Total I/O In: 90.9 [I.V.:90.9] Out: 75 [Urine:75]  Vent settings for last 24 hours: Vent Mode: PRVC FiO2 (%):  [40 %-100 %] 40 % Set Rate:  [14 bmp-16 bmp] 14 bmp Vt Set:  [650 mL] 650 mL PEEP:  [5 cmH20] 5 cmH20 Plateau Pressure:  [25 cmH20-28 cmH20] 25 cmH20  Physical Exam:  Gen: comfortable, no distress Neuro: sedated on exam HEENT: PERRL Neck: c-collar in place CV: RRR and hypertensive Pulm: unlabored breathing on mechanical ventilation-full support Abd: soft, NT   ,    GU: urine clear and yellow, +Foley Extr: wwp, no edema  Results for orders placed or performed during the hospital encounter of 05/06/23 (from the past 24 hours)  Comprehensive metabolic panel     Status: Abnormal   Collection Time: 05/06/23  4:21 PM  Result Value Ref Range   Sodium 137 135 - 145 mmol/L   Potassium 3.4 (L) 3.5 - 5.1 mmol/L   Chloride 98 98 - 111 mmol/L   CO2 28 22 - 32 mmol/L   Glucose, Bld 219 (H) 70 - 99 mg/dL   BUN 12 6 - 20 mg/dL   Creatinine, Ser 0.98 0.61 - 1.24 mg/dL   Calcium 8.9 8.9 - 11.9 mg/dL   Total Protein 7.8 6.5 - 8.1 g/dL   Albumin 3.3 (L) 3.5 - 5.0 g/dL   AST 147 (H) 15 - 41 U/L   ALT 111 (H) 0 - 44 U/L   Alkaline Phosphatase 150 (H) 38 - 126 U/L   Total Bilirubin 0.8 <1.2 mg/dL   GFR, Estimated >82 >95 mL/min   Anion gap  11 5 - 15  CBC     Status: Abnormal   Collection Time: 05/06/23  4:21 PM  Result Value Ref Range   WBC 10.1 4.0 - 10.5 K/uL   RBC 4.27 4.22 - 5.81 MIL/uL   Hemoglobin 12.0 (L) 13.0 - 17.0 g/dL   HCT 62.1 (L) 30.8 - 65.7 %   MCV 90.9 80.0 - 100.0 fL   MCH 28.1 26.0 - 34.0 pg   MCHC 30.9 30.0 - 36.0 g/dL   RDW 84.6 96.2 - 95.2 %   Platelets 360 150 - 400 K/uL   nRBC 0.0 0.0 - 0.2 %  Ethanol     Status: None   Collection Time: 05/06/23  4:21 PM  Result Value Ref Range   Alcohol, Ethyl (B) <10 <10 mg/dL  Protime-INR     Status: Abnormal   Collection Time: 05/06/23  4:21 PM  Result Value Ref Range   Prothrombin Time 15.8 (H) 11.4 - 15.2 seconds   INR 1.2 0.8 - 1.2  Sample to Blood Bank     Status: None   Collection Time: 05/06/23  4:21 PM  Result Value Ref Range   Blood Bank Specimen SAMPLE AVAILABLE FOR TESTING  Sample Expiration      05/09/2023,2359 Performed at [facility], [address], [place], [place] [address]   Troponin I (High Sensitivity)     Status: None   Collection Time: 05/06/23  4:22 PM  Result Value Ref Range   Troponin I (High Sensitivity) 7 <18 ng/L  Brain natriuretic peptide     Status: None   Collection Time: 05/06/23  4:22 PM  Result Value Ref Range   B Natriuretic Peptide 16.6 0.0 - 100.0 pg/mL  CDS serology     Status: None   Collection Time: 05/06/23  4:22 PM  Result Value Ref Range   CDS serology specimen      SPECIMEN WILL BE HELD FOR 14 DAYS IF TESTING IS REQUIRED  Urinalysis, Routine w reflex microscopic -Urine, Catheterized     Status: Abnormal   Collection Time: 05/06/23  4:28 PM  Result Value Ref Range   Color, Urine YELLOW YELLOW   APPearance CLEAR CLEAR   Specific Gravity, Urine 1.016 1.005 - 1.030   pH 5.0 5.0 - 8.0   Glucose, UA 150 (A) NEGATIVE mg/dL   Hgb urine dipstick SMALL (A) NEGATIVE   Bilirubin Urine NEGATIVE NEGATIVE   Ketones, ur NEGATIVE NEGATIVE mg/dL   Protein, ur 914 (A) NEGATIVE mg/dL   Nitrite NEGATIVE  NEGATIVE   Leukocytes,Ua NEGATIVE NEGATIVE   RBC / HPF >50 0 - 5 RBC/hpf   WBC, UA 0-5 0 - 5 WBC/hpf   Bacteria, UA RARE (A) NONE SEEN   Squamous Epithelial / HPF 0-5 0 - 5 /HPF   Hyaline Casts, UA PRESENT   POC CBG, ED     Status: Abnormal   Collection Time: 05/06/23  4:30 PM  Result Value Ref Range   Glucose-Capillary 204 (H) 70 - 99 mg/dL  I-Stat Chem 8, ED     Status: Abnormal   Collection Time: 05/06/23  4:32 PM  Result Value Ref Range   Sodium 138 135 - 145 mmol/L   Potassium 3.4 (L) 3.5 - 5.1 mmol/L   Chloride 96 (L) 98 - 111 mmol/L   BUN 14 6 - 20 mg/dL   Creatinine, Ser 7.82 0.61 - 1.24 mg/dL   Glucose, Bld 956 (H) 70 - 99 mg/dL   Calcium, Ion 2.13 (L) 1.15 - 1.40 mmol/L   TCO2 30 22 - 32 mmol/L   Hemoglobin 12.9 (L) 13.0 - 17.0 g/dL   HCT 08.6 (L) 57.8 - 46.9 %  I-Stat Lactic Acid, ED     Status: Abnormal   Collection Time: 05/06/23  4:33 PM  Result Value Ref Range   Lactic Acid, Venous 3.0 (HH) 0.5 - 1.9 mmol/L   Comment NOTIFIED PHYSICIAN   I-Stat arterial blood gas, ED     Status: Abnormal   Collection Time: 05/06/23  5:26 PM  Result Value Ref Range   pH, Arterial 7.397 7.35 - 7.45   pCO2 arterial 55.4 (H) 32 - 48 mmHg   pO2, Arterial 201 (H) 83 - 108 mmHg   Bicarbonate 34.6 (H) 20.0 - 28.0 mmol/L   TCO2 36 (H) 22 - 32 mmol/L   O2 Saturation 100 %   Acid-Base Excess 7.0 (H) 0.0 - 2.0 mmol/L   Sodium 135 135 - 145 mmol/L   Potassium 3.5 3.5 - 5.1 mmol/L   Calcium, Ion 1.17 1.15 - 1.40 mmol/L   HCT 38.0 (L) 39.0 - 52.0 %   Hemoglobin 12.9 (L) 13.0 - 17.0 g/dL   Patient temperature 62.9 F  Sample type ARTERIAL   Glucose, capillary     Status: Abnormal   Collection Time: 05/06/23  7:53 PM  Result Value Ref Range   Glucose-Capillary 216 (H) 70 - 99 mg/dL  MRSA Next Gen by PCR, Nasal     Status: None   Collection Time: 05/06/23  8:12 PM   Specimen: Nasal Mucosa; Nasal Swab  Result Value Ref Range   MRSA by PCR Next Gen NOT DETECTED NOT DETECTED   Troponin I (High Sensitivity)     Status: Abnormal   Collection Time: 05/06/23  8:25 PM  Result Value Ref Range   Troponin I (High Sensitivity) 33 (H) <18 ng/L  Hemoglobin A1c     Status: Abnormal   Collection Time: 05/06/23  8:25 PM  Result Value Ref Range   Hgb A1c MFr Bld 10.0 (H) 4.8 - 5.6 %   Mean Plasma Glucose 240.3 mg/dL  HIV Antibody (routine testing w rflx)     Status: None   Collection Time: 05/06/23  8:25 PM  Result Value Ref Range   HIV Screen 4th Generation wRfx Non Reactive Non Reactive  Glucose, capillary     Status: Abnormal   Collection Time: 05/06/23 11:37 PM  Result Value Ref Range   Glucose-Capillary 244 (H) 70 - 99 mg/dL  Triglycerides     Status: None   Collection Time: 05/07/23  2:59 AM  Result Value Ref Range   Triglycerides 83 <150 mg/dL  CBC     Status: Abnormal   Collection Time: 05/07/23  2:59 AM  Result Value Ref Range   WBC 12.3 (H) 4.0 - 10.5 K/uL   RBC 4.50 4.22 - 5.81 MIL/uL   Hemoglobin 13.0 13.0 - 17.0 g/dL   HCT 16.1 09.6 - 04.5 %   MCV 90.0 80.0 - 100.0 fL   MCH 28.9 26.0 - 34.0 pg   MCHC 32.1 30.0 - 36.0 g/dL   RDW 40.9 81.1 - 91.4 %   Platelets 316 150 - 400 K/uL   nRBC 0.0 0.0 - 0.2 %  Glucose, capillary     Status: Abnormal   Collection Time: 05/07/23  3:46 AM  Result Value Ref Range   Glucose-Capillary 193 (H) 70 - 99 mg/dL  Basic metabolic panel     Status: Abnormal   Collection Time: 05/07/23  7:03 AM  Result Value Ref Range   Sodium 136 135 - 145 mmol/L   Potassium 3.8 3.5 - 5.1 mmol/L   Chloride 97 (L) 98 - 111 mmol/L   CO2 26 22 - 32 mmol/L   Glucose, Bld 169 (H) 70 - 99 mg/dL   BUN 10 6 - 20 mg/dL   Creatinine, Ser 7.82 0.61 - 1.24 mg/dL   Calcium 9.1 8.9 - 95.6 mg/dL   GFR, Estimated >21 >30 mL/min   Anion gap 13 5 - 15  Magnesium     Status: None   Collection Time: 05/07/23  7:03 AM  Result Value Ref Range   Magnesium 2.0 1.7 - 2.4 mg/dL  Phosphorus     Status: None   Collection Time: 05/07/23  7:03 AM   Result Value Ref Range   Phosphorus 3.3 2.5 - 4.6 mg/dL  Glucose, capillary     Status: Abnormal   Collection Time: 05/07/23  7:57 AM  Result Value Ref Range   Glucose-Capillary 167 (H) 70 - 99 mg/dL    Assessment & Plan:  Present on Admission:  Cardiac arrest (HCC)    LOS: 1 day  Additional comments:I reviewed the patient's new clinical lab test results.   and I reviewed the patients new imaging test results.     49M with multiple significant medical problems who presents postcardiac arrest which was complicated by a fall.   Acute C1 and C2 fracture including left C1 vertebral artery foramen and suspected associated epidural hemorrhage at C1-C2 - NSGY c/s, Dr. Danielle Dess, maintain hard collar, recommend MRI c-spine. CTA negative for vertebral dissection but notes new trace falcine SDH and worsened displacement of dens. Repeat head CT this AM negative for ICH. Has been reviewed by a second radiologist who confirms CT this AM as negative. I discussed radiographic findings with Dr. Danielle Dess this AM. B nasal bone fractures and possible fracture of the osseous nasal septum - ENT c/s, Dr. Elijah Birk to see. VDRF - full support Cardiac arrest - being managed by CCM, undergoing cooling    Additional imaging findings- -Cardiomegaly,  -IVC filter in place with very small caliber IVC and with substantial venous collateral structures in the abdomen and subcutaneous tissues as before. - Mild thickening of portions of the appendix up to 1.0 cm in diameter without surrounding inflammatory findings. Strictly speaking, mild appendicitis is not excluded, and appendiceal mass could also have this appearance - rec outpatient followup - Remote superior endplate compressions at L1 and L2, no change from 12/19/2018 -Aortic atherosclerosis -Possible severe stenosis in the distal left V4 and left P   FEN - recommend tube feeds, place cortrak 12/18 DVT - SCDs, hold chemical ppx due to bleeding concerns, trauma  dosing of lovenox would be at least 40mg  BID or can consider 65mg  BID (weight based dosing of 0.5mg /kg BID) Dispo - ICU   Critical Care Total Time: 40 minutes  Diamantina Monks, MD Trauma & General Surgery Please use [email or web address] to contact on call provider  05/07/2023  *Care during the described time interval was provided by me. I have reviewed this patient's available data, including medical history, events of note, physical examination and test results as part of my evaluation.

## 2023-05-07 NOTE — Progress Notes (Signed)
Patient ID: Glenn Henderson, male   DOB: 06/24/1965, 57 y.o.   MRN: [id number] Currently sedated and on ventilator.  During the day the nurses note however that he will open eyes and follow commands with his upper extremities and his lower extremities is felt that he moves his lower extremities more strongly than his uppers.  He is due to have an MRI of the cervical spine to better delineate and pathoanatomy at the craniocervical junction.  Will await this result.

## 2023-05-07 NOTE — Consult Note (Signed)
Reason for Consult: Nasal fracture Referring Physician: Trauma service  Glenn Henderson is an 57 y.o. male.  HPI: 57 year old male with a closed head injury and reported nasal fracture seen incidentally on a CT scan of the head.  I was consulted for management of the nasal fracture.  I can obtain no further history of present illness from the patient.  History reviewed. No pertinent past medical history.  History reviewed. No pertinent family history.  Social History:  has no history on file for tobacco use, alcohol use, and drug use.  Allergies: Not on File  Medications: I have reviewed the patient's current medications.  Results for orders placed or performed during the hospital encounter of 05/06/23 (from the past 48 hours)  Comprehensive metabolic panel     Status: Abnormal   Collection Time: 05/06/23  4:21 PM  Result Value Ref Range   Sodium 137 135 - 145 mmol/L   Potassium 3.4 (L) 3.5 - 5.1 mmol/L   Chloride 98 98 - 111 mmol/L   CO2 28 22 - 32 mmol/L   Glucose, Bld 219 (H) 70 - 99 mg/dL    Comment: Glucose reference range applies only to samples taken after fasting for at least 8 hours.   BUN 12 6 - 20 mg/dL   Creatinine, Ser 2.53 0.61 - 1.24 mg/dL   Calcium 8.9 8.9 - 66.4 mg/dL   Total Protein 7.8 6.5 - 8.1 g/dL   Albumin 3.3 (L) 3.5 - 5.0 g/dL   AST 403 (H) 15 - 41 U/L   ALT 111 (H) 0 - 44 U/L   Alkaline Phosphatase 150 (H) 38 - 126 U/L   Total Bilirubin 0.8 <1.2 mg/dL   GFR, Estimated >47 >42 mL/min    Comment: (NOTE) Calculated using the CKD-EPI Creatinine Equation (2021)    Anion gap 11 5 - 15    Comment: Performed at [facility], [address] [place], [place] [address]  CBC     Status: Abnormal   Collection Time: 05/06/23  4:21 PM  Result Value Ref Range   WBC 10.1 4.0 - 10.5 K/uL   RBC 4.27 4.22 - 5.81 MIL/uL   Hemoglobin 12.0 (L) 13.0 - 17.0 g/dL   HCT 87.5 (L) 64.3 - 32.9 %   MCV 90.9 80.0 - 100.0 fL   MCH 28.1 26.0 - 34.0 pg   MCHC 30.9 30.0 -  36.0 g/dL   RDW 51.8 84.1 - 66.0 %   Platelets 360 150 - 400 K/uL   nRBC 0.0 0.0 - 0.2 %    Comment: Performed at [facility], [address] [place], [place] [address]  Ethanol     Status: None   Collection Time: 05/06/23  4:21 PM  Result Value Ref Range   Alcohol, Ethyl (B) <10 <10 mg/dL    Comment: (NOTE) Lowest detectable limit for serum alcohol is 10 mg/dL.  For medical purposes only. Performed at [facility], [address] [place], [place] [address]   Protime-INR     Status: Abnormal   Collection Time: 05/06/23  4:21 PM  Result Value Ref Range   Prothrombin Time 15.8 (H) 11.4 - 15.2 seconds   INR 1.2 0.8 - 1.2    Comment: (NOTE) INR goal varies based on device and disease states. Performed at [facility], [address], [place], [place] [address]   Sample to Blood Bank     Status: None   Collection Time: 05/06/23  4:21 PM  Result Value Ref Range   Blood Bank Specimen SAMPLE AVAILABLE FOR TESTING    Sample Expiration      05/09/2023,2359 Performed at [facility], [address] [place], [place] [address]   Troponin I (High Sensitivity)     Status: None   Collection Time: 05/06/23  4:22 PM  Result Value Ref Range   Troponin I (High Sensitivity) 7 <18 ng/L    Comment: (NOTE) Elevated high sensitivity troponin I (hsTnI) values and significant  changes across serial measurements may suggest ACS but many other  chronic and acute conditions are known to elevate hsTnI results.  Refer to the "Links" section for chest pain algorithms and additional  guidance. Performed at [facility], [address], [place], [place] [address]   Brain natriuretic peptide     Status: None   Collection Time: 05/06/23  4:22 PM  Result Value Ref Range   B Natriuretic Peptide 16.6 0.0 - 100.0 pg/mL    Comment: Performed at [facility], [address] [place], [place] [address]  CDS serology     Status: None   Collection Time: 05/06/23  4:22  PM  Result Value Ref Range   CDS serology specimen      SPECIMEN WILL BE HELD FOR 14 DAYS IF TESTING IS REQUIRED    Comment: Performed at [facility], [address] [place], [place] [address]  Urinalysis, Routine w reflex microscopic -Urine, Catheterized     Status: Abnormal   Collection Time: 05/06/23  4:28 PM  Result Value Ref Range   Color, Urine YELLOW YELLOW   APPearance CLEAR CLEAR   Specific Gravity, Urine 1.016 1.005 - 1.030   pH 5.0 5.0 - 8.0   Glucose, UA 150 (A) NEGATIVE mg/dL   Hgb urine dipstick SMALL (A) NEGATIVE   Bilirubin Urine NEGATIVE NEGATIVE   Ketones, ur NEGATIVE NEGATIVE mg/dL   Protein, ur 295 (A) NEGATIVE mg/dL   Nitrite NEGATIVE NEGATIVE   Leukocytes,Ua NEGATIVE NEGATIVE   RBC / HPF >50 0 - 5 RBC/hpf   WBC, UA 0-5 0 - 5 WBC/hpf   Bacteria, UA RARE (A) NONE SEEN   Squamous Epithelial / HPF 0-5 0 - 5 /HPF   Hyaline Casts, UA PRESENT     Comment: Performed at [facility], [address] [place], [place] [address]  POC CBG, ED     Status: Abnormal   Collection Time: 05/06/23  4:30 PM  Result Value Ref Range   Glucose-Capillary 204 (H) 70 - 99 mg/dL    Comment: Glucose reference range applies only to samples taken after fasting for at least 8 hours.  I-Stat Chem 8, ED     Status: Abnormal   Collection Time: 05/06/23  4:32 PM  Result Value Ref Range   Sodium 138 135 - 145 mmol/L   Potassium 3.4 (L) 3.5 - 5.1 mmol/L   Chloride 96 (L) 98 - 111 mmol/L   BUN 14 6 - 20 mg/dL   Creatinine, Ser 8.65 0.61 - 1.24 mg/dL   Glucose, Bld 784 (H) 70 - 99 mg/dL    Comment: Glucose reference range applies only to samples taken after fasting for at least 8 hours.   Calcium, Ion 1.10 (L) 1.15 - 1.40 mmol/L   TCO2 30 22 - 32 mmol/L   Hemoglobin 12.9 (L) 13.0 - 17.0 g/dL   HCT 69.6 (L) 29.5 - 28.4 %  I-Stat Lactic Acid, ED  Status: Abnormal   Collection Time: 05/06/23  4:33 PM  Result Value Ref Range   Lactic Acid, Venous 3.0 (HH) 0.5 - 1.9 mmol/L    Comment NOTIFIED PHYSICIAN   I-Stat arterial blood gas, ED     Status: Abnormal   Collection Time: 05/06/23  5:26 PM  Result Value Ref Range   pH, Arterial 7.397 7.35 - 7.45   pCO2 arterial 55.4 (H) 32 - 48 mmHg   pO2, Arterial 201 (H) 83 - 108 mmHg   Bicarbonate 34.6 (H) 20.0 - 28.0 mmol/L   TCO2 36 (H) 22 - 32 mmol/L   O2 Saturation 100 %   Acid-Base Excess 7.0 (H) 0.0 - 2.0 mmol/L   Sodium 135 135 - 145 mmol/L   Potassium 3.5 3.5 - 5.1 mmol/L   Calcium, Ion 1.17 1.15 - 1.40 mmol/L   HCT 38.0 (L) 39.0 - 52.0 %   Hemoglobin 12.9 (L) 13.0 - 17.0 g/dL   Patient temperature 16.1 F    Sample type ARTERIAL   Glucose, capillary     Status: Abnormal   Collection Time: 05/06/23  7:53 PM  Result Value Ref Range   Glucose-Capillary 216 (H) 70 - 99 mg/dL    Comment: Glucose reference range applies only to samples taken after fasting for at least 8 hours.  MRSA Next Gen by PCR, Nasal     Status: None   Collection Time: 05/06/23  8:12 PM   Specimen: Nasal Mucosa; Nasal Swab  Result Value Ref Range   MRSA by PCR Next Gen NOT DETECTED NOT DETECTED    Comment: (NOTE) The GeneXpert MRSA Assay (FDA approved for NASAL specimens only), is one component of a comprehensive MRSA colonization surveillance program. It is not intended to diagnose MRSA infection nor to guide or monitor treatment for MRSA infections. Test performance is not FDA approved in patients less than 63 years old. Performed at [facility], [address], [place], [place] [address]   Troponin I (High Sensitivity)     Status: Abnormal   Collection Time: 05/06/23  8:25 PM  Result Value Ref Range   Troponin I (High Sensitivity) 33 (H) <18 ng/L    Comment: RESULT CALLED TO, READ BACK BY AND VERIFIED WITH Odis Hollingshead, RN 2122 05/06/2023 SANDOVAL K (NOTE) Elevated high sensitivity troponin I (hsTnI) values and significant  changes across serial measurements may suggest ACS but many other  chronic and acute conditions are  known to elevate hsTnI results.  Refer to the "Links" section for chest pain algorithms and additional  guidance. Performed at [facility], [address] [place], [place] [address]   Hemoglobin A1c     Status: Abnormal   Collection Time: 05/06/23  8:25 PM  Result Value Ref Range   Hgb A1c MFr Bld 10.0 (H) 4.8 - 5.6 %    Comment: (NOTE) Pre diabetes:          5.7%-6.4%  Diabetes:              >6.4%  Glycemic control for   <7.0% adults with diabetes    Mean Plasma Glucose 240.3 mg/dL    Comment: Performed at [facility], [address], [place], [place] [address]  HIV Antibody (routine testing w rflx)     Status: None   Collection Time: 05/06/23  8:25 PM  Result Value Ref Range   HIV Screen 4th Generation wRfx Non Reactive Non Reactive    Comment: Performed at [facility], [address], [place], [place] [address]  Glucose, capillary     Status: Abnormal   Collection Time: 05/06/23 11:37 PM  Result Value Ref Range   Glucose-Capillary 244 (H) 70 - 99 mg/dL    Comment: Glucose reference range applies only to samples taken after fasting for at least 8 hours.  Triglycerides     Status: None   Collection Time: 05/07/23  2:59 AM  Result Value Ref Range   Triglycerides 83 <150 mg/dL    Comment: Performed at [facility], [address], [place], [place] [address]  CBC     Status: Abnormal   Collection Time: 05/07/23  2:59 AM  Result Value Ref Range   WBC 12.3 (H) 4.0 - 10.5 K/uL   RBC 4.50 4.22 - 5.81 MIL/uL   Hemoglobin 13.0 13.0 - 17.0 g/dL   HCT 41.3 24.4 - 01.0 %   MCV 90.0 80.0 - 100.0 fL   MCH 28.9 26.0 - 34.0 pg   MCHC 32.1 30.0 - 36.0 g/dL   RDW 27.2 53.6 - 64.4 %   Platelets 316 150 - 400 K/uL   nRBC 0.0 0.0 - 0.2 %    Comment: Performed at [facility], [address] [place], [place] [address]  Glucose, capillary     Status: Abnormal   Collection Time: 05/07/23  3:46 AM  Result Value Ref Range   Glucose-Capillary 193 (H) 70 -  99 mg/dL    Comment: Glucose reference range applies only to samples taken after fasting for at least 8 hours.  Basic metabolic panel     Status: Abnormal   Collection Time: 05/07/23  7:03 AM  Result Value Ref Range   Sodium 136 135 - 145 mmol/L   Potassium 3.8 3.5 - 5.1 mmol/L   Chloride 97 (L) 98 - 111 mmol/L   CO2 26 22 - 32 mmol/L   Glucose, Bld 169 (H) 70 - 99 mg/dL    Comment: Glucose reference range applies only to samples taken after fasting for at least 8 hours.   BUN 10 6 - 20 mg/dL   Creatinine, Ser 2.59 0.61 - 1.24 mg/dL   Calcium 9.1 8.9 - 56.3 mg/dL   GFR, Estimated >87 >56 mL/min    Comment: (NOTE) Calculated using the CKD-EPI Creatinine Equation (2021)    Anion gap 13 5 - 15    Comment: Performed at [facility], [address], [place], [place] [address]  Magnesium     Status: None   Collection Time: 05/07/23  7:03 AM  Result Value Ref Range   Magnesium 2.0 1.7 - 2.4 mg/dL    Comment: Performed at [facility], [address] [place], [place] [address]  Phosphorus     Status: None   Collection Time: 05/07/23  7:03 AM  Result Value Ref Range   Phosphorus 3.3 2.5 - 4.6 mg/dL    Comment: Performed at [facility], [address] [place], [place] [address]  Glucose, capillary     Status: Abnormal   Collection Time: 05/07/23  7:57 AM  Result Value Ref Range   Glucose-Capillary 167 (H) 70 - 99 mg/dL    Comment: Glucose reference range applies only to samples taken after fasting for at least 8 hours.  Hepatic function panel     Status: Abnormal   Collection Time: 05/07/23  9:22 AM  Result Value Ref Range   Total Protein 7.9 6.5 -  8.1 g/dL   Albumin 3.3 (L) 3.5 - 5.0 g/dL   AST 87 (H) 15 - 41 U/L   ALT 143 (H) 0 - 44 U/L   Alkaline Phosphatase 153 (H) 38 - 126 U/L   Total Bilirubin 0.9 <1.2 mg/dL   Bilirubin, Direct 0.3 (H) 0.0 - 0.2 mg/dL   Indirect Bilirubin 0.6 0.3 - 0.9 mg/dL    Comment: Performed at [facility], [address] [place], [place] [address]  Rapid urine drug screen (hospital performed)     Status: None   Collection Time: 05/07/23 10:10 AM  Result Value Ref Range   Opiates NONE DETECTED NONE DETECTED   Cocaine NONE DETECTED NONE DETECTED   Benzodiazepines NONE DETECTED NONE DETECTED   Amphetamines NONE DETECTED NONE DETECTED   Tetrahydrocannabinol NONE DETECTED NONE DETECTED   Barbiturates NONE DETECTED NONE DETECTED    Comment: (NOTE) DRUG SCREEN FOR MEDICAL PURPOSES ONLY.  IF CONFIRMATION IS NEEDED FOR ANY PURPOSE, NOTIFY LAB WITHIN 5 DAYS.  LOWEST DETECTABLE LIMITS FOR URINE DRUG SCREEN Drug Class                     Cutoff (ng/mL) Amphetamine and metabolites    1000 Barbiturate and metabolites    200 Benzodiazepine                 200 Opiates and metabolites        300 Cocaine and metabolites        300 THC                            50 Performed at [facility], [address], [place], [place] [address]   Ethanol     Status: None   Collection Time: 05/07/23 11:48 AM  Result Value Ref Range   Alcohol, Ethyl (B) <10 <10 mg/dL    Comment: (NOTE) Lowest detectable limit for serum alcohol is 10 mg/dL.  For medical purposes only. Performed at [facility], [address], [place], [place] [address]   Glucose, capillary     Status: Abnormal   Collection Time: 05/07/23 11:51 AM  Result Value Ref Range   Glucose-Capillary 157 (H) 70 - 99 mg/dL    Comment: Glucose reference range applies only to samples taken after fasting for at least 8 hours.    EEG adult Result Date: 05/07/2023 Charlsie Quest, MD     05/07/2023 10:08 AM Patient Name: Glenn Henderson MRN: [id number] Epilepsy Attending: Charlsie Quest Referring Physician/Provider: Cheri Fowler, MD Date: 05/07/2023 Duration: 24.03 mins Patient history: 57yo M s/p cardiac arrest getting eeg to evaluate for seizure Level of alertness: comatose AEDs during EEG study: Propofol Technical aspects: This EEG study was done with  scalp electrodes positioned according to the 10-20 International system of electrode placement. Electrical activity was reviewed with band pass filter of 1-70Hz , sensitivity of 7 uV/mm, display speed of 40mm/sec with a 60Hz  notched filter applied as appropriate. EEG data were recorded continuously and digitally stored.  Video monitoring was available and reviewed as appropriate. Description: EEG showed continuous generalized 3 to 6 Hz theta-delta slowing admixed with 12-13 Hz beta activity distributed symmetrically and diffusely. Hyperventilation and photic stimulation were not performed.   ABNORMALITY - Continuous slow, generalized IMPRESSION: This study is suggestive of severe diffuse encephalopathy likely related to sedation. No seizures or epileptiform discharges were seen throughout the recording. Priyanka Annabelle Harman  CT HEAD WO CONTRAST ( ) Result Date: 05/07/2023 CLINICAL DATA:  Head trauma EXAM: CT HEAD WITHOUT CONTRAST TECHNIQUE: Contiguous axial images were obtained from the base of the skull through the vertex without intravenous contrast. RADIATION DOSE REDUCTION: This exam was performed according to the departmental dose-optimization program which includes automated exposure control, adjustment of the mA and/or kV according to patient size and/or use of iterative reconstruction technique. COMPARISON:  None Available. FINDINGS: Brain: No mass, hemorrhage or extra-axial collection. There is hypoattenuation of the white matter. CSF spaces are normal. Vascular: There is atherosclerotic calcification of both internal carotid arteries at the skull base. Skull: Known fractures at C1 and C2 are better characterized on recent cervical spine CT. Sinuses/Orbits: Negative Other: None IMPRESSION: 1. No acute intracranial abnormality. 2. Known fractures at C1 and C2 are better characterized on recent cervical spine CT. Electronically Signed   By: Deatra Robinson M.D.   On: 05/07/2023 02:58   CT ANGIO HEAD NECK W  WO CM Result Date: 05/06/2023 CLINICAL DATA:  Cervical spine fracture with concern for arterial injury EXAM: CT ANGIOGRAPHY HEAD AND NECK WITH AND WITHOUT CONTRAST TECHNIQUE: Multidetector CT imaging of the head and neck was performed using the standard protocol during bolus administration of intravenous contrast. Multiplanar CT image reconstructions and MIPs were obtained to evaluate the vascular anatomy. Carotid stenosis measurements (when applicable) are obtained utilizing NASCET criteria, using the distal internal carotid diameter as the denominator. RADIATION DOSE REDUCTION: This exam was performed according to the departmental dose-optimization program which includes automated exposure control, adjustment of the mA and/or kV according to patient size and/or use of iterative reconstruction technique. CONTRAST:  75mL OMNIPAQUE IOHEXOL 350 MG/ML SOLN COMPARISON:  No prior CTA available, correlation is made with 05/06/2023 CT head FINDINGS: CT HEAD FINDINGS Brain: Increasing hyperdensity along the posterior falx and tentorium, likely trace subdural hemorrhage, which was not notable on the prior exam. No evidence of acute infarct, hemorrhage, mass, mass effect, or midline shift. No hydrocephalus. Vascular: No hyperdense vessel. Skull: Negative for fracture or focal lesion. Right frontal scalp hematoma. Could Sinuses/Orbits: Mucosal thickening in the right greater than left maxillary sinus, ethmoid air cells, and right greater than left frontal sinuses. Other: The mastoid air cells are well aerated. CTA NECK FINDINGS Aortic arch: Two-vessel arch with a common origin of the brachiocephalic and left common carotid arteries. Imaged portion shows no evidence of aneurysm or dissection. No significant stenosis of the major arch vessel origins. Right carotid system: No evidence of dissection, occlusion, or hemodynamically significant stenosis (greater than 50%). Left carotid system: No evidence of dissection, occlusion,  or hemodynamically significant stenosis (greater than 50%). Vertebral arteries: The left vertebral artery is diminutive throughout its course. No definite abnormality is seen in the left vertebral artery as it passes by the fracture of the left C1 vertebral artery foramen (series 10, image 170). The right vertebral artery is dominant and is patent from its origin to the skull base, without evidence of dissection or hemodynamically significant stenosis. Skeleton: For detailed osseous findings, please see same-day CT head and cervical spine. Compared to the recent CT cervical spine, there is now increased anterior displacement of the dens relative to the body of C2, measuring approximately 4 mm (series 12, image 123), previously up to 1 mm. Other neck: No acute finding. Upper chest: For findings in the thorax, please see same day CT chest. Review of the MIP images confirms the above findings CTA HEAD FINDINGS Anterior circulation: Both internal carotid  arteries are patent to the termini, with calcifications but without significant stenosis. A1 segments patent. Normal anterior communicating artery. Anterior cerebral arteries are patent to their distal aspects without significant stenosis. No M1 stenosis or occlusion. MCA branches perfused to their distal aspects without significant stenosis. Posterior circulation: Vertebral arteries patent to the vertebrobasilar junction, with possible severe stenosis distal left V4 (series 10, image 136). Basilar patent to its distal aspect without significant stenosis. Superior cerebellar arteries patent proximally. Patent P1 segments. Severe stenosis in the left P2 (series 12, image 139). PCAs otherwise perfused to their distal aspects without significant stenosis. The bilateral posterior communicating arteries are not visualized. Venous sinuses: As permitted by contrast timing, patent. Anatomic variants: None significant. No evidence of aneurysm or vascular malformation. Review of  the MIP images confirms the above findings IMPRESSION: 1. Increasing hyperdensity along the posterior falx and tentorium, likely trace subdural hemorrhage, which is new from the prior exam. 2. The left vertebral artery is diminutive throughout its course. No definite acute abnormality is seen in the left vertebral artery as it passes by the fracture of the left C1 vertebral artery foramen. 3. Compared to the recent CT cervical spine, there is now increased anterior displacement of the dens relative to the body of C2, measuring approximately 4 mm, previously up to 1 mm. 4. Possible severe stenosis in the distal left V4 and left P2. Imaging results were communicated on 05/06/2023 at 6:33 pm to provider [facility] via secure text paging. Electronically Signed   By: Wiliam Ke M.D.   On: 05/06/2023 18:33   DG Chest Port 1 View Result Date: 05/06/2023 CLINICAL DATA:  Pain after trauma EXAM: PORTABLE CHEST 1 VIEW COMPARISON:  X-ray 04/15/2023 FINDINGS: ET tube in place with tip 3 cm above the carina proximally. Enteric tube in place with tip extending beneath the diaphragm. Underinflation with widened mediastinum. Enlarged cardiopericardial silhouette. Vascular congestion. No pneumothorax or effusion. Overlapping cardiac leads. Left shoulder arthroplasty seen at the edge of the imaging field. IMPRESSION: New ET tube and enteric tube. Underinflation with enlarged cardiopericardial silhouette with vascular congestion widened mediastinum. With changes recommend further workup with contrast CT when clinically appropriate Electronically Signed   By: Karen Kays M.D.   On: 05/06/2023 18:26   CT Angio Chest PE W and/or Wo Contrast Result Date: 05/06/2023 CLINICAL DATA:  Cardiac arrest. Blunt abdominal trauma. Cervical spine fracture. EXAM: CT ANGIOGRAPHY CHEST CT ABDOMEN AND PELVIS WITH CONTRAST TECHNIQUE: Multidetector CT imaging of the chest was performed using the standard protocol during bolus administration of  intravenous contrast. Multiplanar CT image reconstructions and MIPs were obtained to evaluate the vascular anatomy. Multidetector CT imaging of the abdomen and pelvis was performed using the standard protocol during bolus administration of intravenous contrast. RADIATION DOSE REDUCTION: This exam was performed according to the departmental dose-optimization program which includes automated exposure control, adjustment of the mA and/or kV according to patient size and/or use of iterative reconstruction technique. CONTRAST:  90mL Omnipaque 350 for CT chest 75 cc Omnipaque 354 CT abdomen/pelvis performed about 70 minutes after the CT chest COMPARISON:  Chest radiograph 05/06/2023, CT abdomen 12/19/2018 FINDINGS: CTA CHEST FINDINGS Cardiovascular: Atherosclerotic calcification of the aortic arch. Moderate cardiomegaly. No filling defect is identified in the pulmonary arterial tree to suggest pulmonary embolus. Mediastinum/Nodes: Mild thyromegaly without focal nodule identified. Lungs/Pleura: Endotracheal tube satisfactorily positioned. Patchy bandlike airspace opacities in both lower lobes mostly from atelectasis, aspiration pneumonitis not excluded. There is also some dependent airspace opacity in the  right upper lobe and bandlike atelectasis in the apicoposterior segment left upper lobe. Musculoskeletal: Degenerative glenohumeral arthropathy on the right with left shoulder arthroplasty. Thoracic spondylosis. Review of the MIP images confirms the above findings. CT ABDOMEN and PELVIS FINDINGS Hepatobiliary: Unremarkable Pancreas: Unremarkable Spleen: Absent.  Minimal regenerative splenic nodularity. Adrenals/Urinary Tract: Foley catheter satisfactorily positioned in the urinary bladder. Kidneys unremarkable. Stomach/Bowel: Nasogastric tube tip in the stomach body. Mild thickening of portions of the appendix up to 1.0 cm in diameter without surrounding inflammatory findings. Strictly speaking, mild appendicitis is not  excluded, and appendiceal mass could also have this appearance. Vascular/Lymphatic: IVC filter noted with very small caliber IVC and with substantial venous collateral structures in the abdomen and subcutaneous tissues as before. Mild aortoiliac atheromatous vascular calcifications. Reproductive: Unremarkable Other: No supplemental non-categorized findings. Musculoskeletal: Remote superior endplate compressions at L1 and L2, no change from 12/19/2018. Review of the MIP images confirms the above findings. IMPRESSION: 1. No filling defect is identified in the pulmonary arterial tree to suggest pulmonary embolus. 2. Patchy bandlike airspace opacities in both lower lobes mostly from atelectasis, aspiration pneumonitis not excluded. There is also some dependent airspace opacity in the right upper lobe and bandlike atelectasis in the apicoposterior segment left upper lobe. 3. Moderate cardiomegaly. 4. IVC filter noted with very small caliber IVC and with substantial venous collateral structures in the abdomen and subcutaneous tissues as before. 5. Mild thickening of portions of the appendix up to 1.0 cm in diameter without surrounding inflammatory findings. Strictly speaking, mild appendicitis is not excluded, and appendiceal mass could also have this appearance (although no appendiceal mass was visible on the CT from 12/19/2018). 6. Remote superior endplate compressions at L1 and L2, no change from 12/19/2018. 7. Aortic atherosclerosis. Aortic Atherosclerosis (ICD10-I70.0). Electronically Signed   By: Gaylyn Rong M.D.   On: 05/06/2023 18:25   CT ABDOMEN PELVIS W CONTRAST Result Date: 05/06/2023 CLINICAL DATA:  Cardiac arrest. Blunt abdominal trauma. Cervical spine fracture. EXAM: CT ANGIOGRAPHY CHEST CT ABDOMEN AND PELVIS WITH CONTRAST TECHNIQUE: Multidetector CT imaging of the chest was performed using the standard protocol during bolus administration of intravenous contrast. Multiplanar CT image  reconstructions and MIPs were obtained to evaluate the vascular anatomy. Multidetector CT imaging of the abdomen and pelvis was performed using the standard protocol during bolus administration of intravenous contrast. RADIATION DOSE REDUCTION: This exam was performed according to the departmental dose-optimization program which includes automated exposure control, adjustment of the mA and/or kV according to patient size and/or use of iterative reconstruction technique. CONTRAST:  90mL Omnipaque 350 for CT chest 75 cc Omnipaque 354 CT abdomen/pelvis performed about 70 minutes after the CT chest COMPARISON:  Chest radiograph 05/06/2023, CT abdomen 12/19/2018 FINDINGS: CTA CHEST FINDINGS Cardiovascular: Atherosclerotic calcification of the aortic arch. Moderate cardiomegaly. No filling defect is identified in the pulmonary arterial tree to suggest pulmonary embolus. Mediastinum/Nodes: Mild thyromegaly without focal nodule identified. Lungs/Pleura: Endotracheal tube satisfactorily positioned. Patchy bandlike airspace opacities in both lower lobes mostly from atelectasis, aspiration pneumonitis not excluded. There is also some dependent airspace opacity in the right upper lobe and bandlike atelectasis in the apicoposterior segment left upper lobe. Musculoskeletal: Degenerative glenohumeral arthropathy on the right with left shoulder arthroplasty. Thoracic spondylosis. Review of the MIP images confirms the above findings. CT ABDOMEN and PELVIS FINDINGS Hepatobiliary: Unremarkable Pancreas: Unremarkable Spleen: Absent.  Minimal regenerative splenic nodularity. Adrenals/Urinary Tract: Foley catheter satisfactorily positioned in the urinary bladder. Kidneys unremarkable. Stomach/Bowel: Nasogastric tube tip in the stomach body.  Mild thickening of portions of the appendix up to 1.0 cm in diameter without surrounding inflammatory findings. Strictly speaking, mild appendicitis is not excluded, and appendiceal mass could also  have this appearance. Vascular/Lymphatic: IVC filter noted with very small caliber IVC and with substantial venous collateral structures in the abdomen and subcutaneous tissues as before. Mild aortoiliac atheromatous vascular calcifications. Reproductive: Unremarkable Other: No supplemental non-categorized findings. Musculoskeletal: Remote superior endplate compressions at L1 and L2, no change from 12/19/2018. Review of the MIP images confirms the above findings. IMPRESSION: 1. No filling defect is identified in the pulmonary arterial tree to suggest pulmonary embolus. 2. Patchy bandlike airspace opacities in both lower lobes mostly from atelectasis, aspiration pneumonitis not excluded. There is also some dependent airspace opacity in the right upper lobe and bandlike atelectasis in the apicoposterior segment left upper lobe. 3. Moderate cardiomegaly. 4. IVC filter noted with very small caliber IVC and with substantial venous collateral structures in the abdomen and subcutaneous tissues as before. 5. Mild thickening of portions of the appendix up to 1.0 cm in diameter without surrounding inflammatory findings. Strictly speaking, mild appendicitis is not excluded, and appendiceal mass could also have this appearance (although no appendiceal mass was visible on the CT from 12/19/2018). 6. Remote superior endplate compressions at L1 and L2, no change from 12/19/2018. 7. Aortic atherosclerosis. Aortic Atherosclerosis (ICD10-I70.0). Electronically Signed   By: Gaylyn Rong M.D.   On: 05/06/2023 18:25   CT HEAD WO CONTRAST Result Date: 05/06/2023 CLINICAL DATA:  Cardiac arrest, collapse with head strike EXAM: CT HEAD WITHOUT CONTRAST CT CERVICAL SPINE WITHOUT CONTRAST TECHNIQUE: Multidetector CT imaging of the head and cervical spine was performed following the standard protocol without intravenous contrast. Multiplanar CT image reconstructions of the cervical spine were also generated. RADIATION DOSE REDUCTION:  This exam was performed according to the departmental dose-optimization program which includes automated exposure control, adjustment of the mA and/or kV according to patient size and/or use of iterative reconstruction technique. COMPARISON:  03/17/2023 CTA head and neck FINDINGS: CT HEAD FINDINGS Brain: No evidence of acute infarct, hemorrhage, mass, mass effect, or midline shift. No hydrocephalus or extra-axial fluid collection. Vascular: No hyperdense vessel. Skull: Negative for calvarial fracture or focal lesion. Right frontal scalp hematoma. Fracture of the bilateral nasal bones. Possible fracture of the osseous nasal septum. Sinuses/Orbits: Partial opacification of the right frontal sinus and ethmoid air cells. No acute finding in the orbits. Other: The mastoid air cells are well aerated. CT CERVICAL SPINE FINDINGS Alignment: No traumatic listhesis. Relative to the [id number] cm, there is widening of the facets at C1-C2, left greater than right and at C2-C3. Skull base and vertebrae: Acute fracture through the base of the dens (series 10, image 28 and series 9, image 53), with approximately 4 mm of distraction. Additional multifocal fractures of the anterior and posterior arch of C1 (series 3, images 26-29 and series 9, images 42, 47, and 68), including a fracture involving the left C1 vertebral artery foramen (series 3, image 31). Soft tissues and spinal canal: Suspect prevertebral edema and epidural hemorrhage at C1-C2. Endotracheal and orogastric tubes are noted. Disc levels: Degenerative changes in the cervical spine.No high-grade spinal canal stenosis. Upper chest: For findings in the thorax, please see same day CT chest. IMPRESSION: 1. Acute fracture through the base of the dens, with approximately 4 mm of distraction. Additional multifocal fractures of the anterior and posterior arch of C1, including a fracture involving the left C1 vertebral artery foramen. CTA of  the neck is recommended to evaluate for  vertebral artery injury. 2. Widening of the facets at C1-C2, left greater than right, and at C2-C3, concerning for ligamentous injury. 3. Suspect prevertebral edema and epidural hemorrhage at C1-C2. 4. Fracture of the bilateral nasal bones. Possible fracture of the osseous nasal septum. 5. No acute intracranial process. These results were called by telephone at the time of interpretation on 05/06/2023 at 5:36 pm to provider [facility] , who verbally acknowledged these results. Electronically Signed   By: Wiliam Ke M.D.   On: 05/06/2023 17:36   CT CERVICAL SPINE WO CONTRAST Result Date: 05/06/2023 CLINICAL DATA:  Cardiac arrest, collapse with head strike EXAM: CT HEAD WITHOUT CONTRAST CT CERVICAL SPINE WITHOUT CONTRAST TECHNIQUE: Multidetector CT imaging of the head and cervical spine was performed following the standard protocol without intravenous contrast. Multiplanar CT image reconstructions of the cervical spine were also generated. RADIATION DOSE REDUCTION: This exam was performed according to the departmental dose-optimization program which includes automated exposure control, adjustment of the mA and/or kV according to patient size and/or use of iterative reconstruction technique. COMPARISON:  03/17/2023 CTA head and neck FINDINGS: CT HEAD FINDINGS Brain: No evidence of acute infarct, hemorrhage, mass, mass effect, or midline shift. No hydrocephalus or extra-axial fluid collection. Vascular: No hyperdense vessel. Skull: Negative for calvarial fracture or focal lesion. Right frontal scalp hematoma. Fracture of the bilateral nasal bones. Possible fracture of the osseous nasal septum. Sinuses/Orbits: Partial opacification of the right frontal sinus and ethmoid air cells. No acute finding in the orbits. Other: The mastoid air cells are well aerated. CT CERVICAL SPINE FINDINGS Alignment: No traumatic listhesis. Relative to the [id number] cm, there is widening of the facets at C1-C2, left greater than  right and at C2-C3. Skull base and vertebrae: Acute fracture through the base of the dens (series 10, image 28 and series 9, image 53), with approximately 4 mm of distraction. Additional multifocal fractures of the anterior and posterior arch of C1 (series 3, images 26-29 and series 9, images 42, 47, and 68), including a fracture involving the left C1 vertebral artery foramen (series 3, image 31). Soft tissues and spinal canal: Suspect prevertebral edema and epidural hemorrhage at C1-C2. Endotracheal and orogastric tubes are noted. Disc levels: Degenerative changes in the cervical spine.No high-grade spinal canal stenosis. Upper chest: For findings in the thorax, please see same day CT chest. IMPRESSION: 1. Acute fracture through the base of the dens, with approximately 4 mm of distraction. Additional multifocal fractures of the anterior and posterior arch of C1, including a fracture involving the left C1 vertebral artery foramen. CTA of the neck is recommended to evaluate for vertebral artery injury. 2. Widening of the facets at C1-C2, left greater than right, and at C2-C3, concerning for ligamentous injury. 3. Suspect prevertebral edema and epidural hemorrhage at C1-C2. 4. Fracture of the bilateral nasal bones. Possible fracture of the osseous nasal septum. 5. No acute intracranial process. These results were called by telephone at the time of interpretation on 05/06/2023 at 5:36 pm to provider [facility] , who verbally acknowledged these results. Electronically Signed   By: Wiliam Ke M.D.   On: 05/06/2023 17:36    Review of Systems Blood pressure 124/74, pulse 71, temperature 98.6 F (37 C), temperature source Bladder, resp. rate 14, height 6\' 2"  (1.88 m), weight 132.9 kg, SpO2 98%.  Physical Exam  Patient is sedated and intubated.  He is noncommunicative. Face is atraumatic without bony step-offs.  Nasal  bones feel stable without obvious step-off or depression Neuro deferred No septal  hematoma No auricular or mastoid hematoma or ecchymosis Neck without crepitance or palpable laryngeal abnormality Oral cavity difficult to assess  CT scan - I reviewed the films and report from the CT scan.  The report states the patient likely has a nasal bone fracture.  If a fracture is present, it is nondisplaced.  Assessment/Plan:  Nasal fracture  This patient has no clinically significant nasal fracture requiring intervention at this time.  Reconsult as needed.  Trixie Dredge. 05/07/2023, 12:57 PM

## 2023-05-07 NOTE — Progress Notes (Signed)
Initial Nutrition Assessment  DOCUMENTATION CODES:   Not applicable  INTERVENTION:   Tube Feeding via OG:  Vital 1.5 at 60 ml/hr Begin TF at rate of 20 ml/hr, titrate by 10 mL q 8 hours until goal rate of 60 ml/hr Pro-Source TF20 60 mL BID TF at goal rate provides 2320 kcals, 137 g of protein and 1094 mL of free water  Additional lipid calories via propofol infusion  NUTRITION DIAGNOSIS:   Inadequate oral intake related to acute illness as evidenced by NPO status.   GOAL:   Patient will meet greater than or equal to 90% of their needs  MONITOR:   Vent status, TF tolerance, Skin, Labs, Weight trends  REASON FOR ASSESSMENT:   Ventilator, Consult    ASSESSMENT:   57 yo male admitted post PEA arrest, +fall with unstable cervical spine C1/C2 fracture, intubated. PMH includes DM, OSA, TBI with left-sided weakness/hemiparesis and seziure disorder, chronic DVT  Pt remains on vent support, plan for MRI brain/spine, Hard neck collar in place. TTM-normothermia Sedated with propofol and fentanyl gtt  EEG negative for active seizures  ENT consulted for nasal fracture; per ENT, no clinically significant nasal fracture requiring any intervention at this time.   Unable to obtain diet and weight history from patient at this time  Current wt 132.9 kg; +moderate edema in BLE  OG tube in place in stomach per CT report Abdomen soft, BS present, no BM  Labs: reviewed Meds: reviewed  NUTRITION - FOCUSED PHYSICAL EXAM: Unable to assess  Diet Order:   Diet Order             Diet NPO time specified  Diet effective now                   EDUCATION NEEDS:   Not appropriate for education at this time  Skin:  Skin Assessment: Reviewed RN Assessment  Last BM:  PTA  Height:   Ht Readings from Last 1 Encounters:  05/06/23 6\' 2"  (1.88 m)    Weight:   Wt Readings from Last 1 Encounters:  05/07/23 132.9 kg     BMI:  Body mass index is 37.62 kg/m.  Estimated  Nutritional Needs:   Kcal:  2200-2400 kcals  Protein:  130-155 g  Fluid:  >/= 2L   Romelle Starcher MS, RDN, LDN, CNSC Registered Dietitian 3 Clinical Nutrition RD Inpatient Contact Info in Amion

## 2023-05-07 NOTE — Progress Notes (Addendum)
NAMESepehr Henderson, MRN:  [id number], DOB:  1966/02/28, LOS: 1 ADMISSION DATE:  05/06/2023, CONSULTATION DATE:  05/06/2023 REFERRING MD:  Dr. Karene Fry - EDP, CHIEF COMPLAINT:  Cardiac arrest    History of Present Illness:  Glenn Henderson is a 57 year old male with past medical history significant for hyperlipidemia, OSA, history of hepatitis, MDD, anxiety, PTSD, prior tobacco abuse, DM2 with peripheral neuropathy and latest A1c 12.4, chronic radicular lumbar pain with chronic pain syndrome, history of DVT and PE on chronic anticoagulation with Eliquis, TBI 2013 with left-sided deficits, history of seizures, abnormal thyroid disorder, who presented to the ED 12/16 via EMS after suffering out-of-hospital cardiac arrest.  Patient required 2 rounds of CPR prior to ROSC. Per EMS patient did hit head when he arrested, arrived in c-collar. On arrival patient was seen hypertensive, mildly hypothermic and tachycardic. Lab work significant for K 3.4, glucose 219, alkaline phosphastat 150, albumin 3.3, AST 142, ALT 111, lactic 3.0, Hgb 12.0. CT cervical spine on admit positive for acute unstable fracture with epidural hemorrhage. PCCM consulted for further assistance and admisison  Pertinent  Medical History  HTN, OSA, diabetes, prior CVA, TBI with hemipparesis, CKD, DVT and PE   Significant Hospital Events: Including procedures, antibiotic start and stop dates in addition to other pertinent events   12/16 admitted post cardiac arrest  Interim History / Subjective:  Per nurse moving BLE to commands and tracks w/ eyes; not moving uppers Sedate on prop/fent On PRVC  Objective   Blood pressure (!) 161/100, pulse 70, temperature 98.1 F (36.7 C), temperature source Bladder, resp. rate 14, height 6\' 2"  (1.88 m), weight 132.9 kg, SpO2 99%.    Vent Mode: PRVC FiO2 (%):  [40 %-100 %] 40 % Set Rate:  [14 bmp-16 bmp] 14 bmp Vt Set:  [650 mL] 650 mL PEEP:  [5 cmH20] 5 cmH20 Plateau Pressure:  [25 cmH20-28 cmH20]  25 cmH20   Intake/Output Summary (Last 24 hours) at 05/07/2023 0831 Last data filed at 05/07/2023 0800 Gross per 24 hour  Intake 1450.95 ml  Output 1915 ml  Net -464.05 ml   Filed Weights   05/06/23 1616 05/07/23 0500  Weight: 127 kg 132.9 kg    Examination: General:  critically ill appearing on mech vent HEENT: MM pink/moist; ETT in place Neuro: sedate; per nurse moving BLE to command and tracks with eyes; perrl; not moving UE CV: s1s2, RRR, no m/r/g PULM:  dim clear BS bilaterally; on mech vent PRVC GI: soft, bsx4 active  Extremities: warm/dry, BLE edema  Skin: no rashes or lesions    Resolved Hospital Problem list     Assessment & Plan:  Out of hospital cardiac arrest -Required 2 round CPR prior to ROSC; CTA chest negative for PE Essential HTN Hyperlipidemia  P: -cont to monitor in icu w/ continuous telemetry monitoring -trend electrolytes and replete as needed -echo -hold home statin w/ elevated LFTs -hold anti-hypertensive's for now  Respiratory insufficiency secondary to above  Possible Aspiration Hx of OSA Prior tobacco use P: -LTVV strategy with tidal volumes of 6-8 cc/kg ideal body weight -Wean PEEP/FiO2 for SpO2 >92% -VAP bundle in place -Daily SAT and SBT -PAD protocol in place -wean sedation for RASS goal 0 to -1 -cont unasyn  Unstable C-spine C1-C2 fracture with possible epidural hematoma  Nasal bone fractures TBI 2013 with left-sided deficits Prior CVA, TBI with hemipparesis P: -NSG and surgery following; appreciate recs -cont c-collar -MRI C-spine and head -EEG -limit sedating meds -Continue neuroprotective  measures- normothermia, euglycemia, HOB greater than 30, head in neutral alignment, normocapnia, normoxia -SCDs for dvt ppx -will likely need face consult when stable for nasal bone fractures  History of DVT and PE on chronic anticoagulation with Eliquis P: -hold AC in setting of epidural hematoma -when to resume AC per  neurosurgery  History of hepatitis Elevated LFTs P: -likely shock liver -trend cmp  DM2 with peripheral neuropathy  -12/17 A1c 10 P: SSI  CBG goal 140-180 CBG checks q4  Peripheral neuropathy Plan: -hold home Cymbalta, gabapentin for now  Anxiety/depression Plan: -hold klonopin  Chronic radicular lumbar pain with chronic pain syndrome Plan: -hold home oxy  Best Practice (right click and "Reselect all SmartList Selections" daily)   Diet/type: NPO w/ oral meds DVT prophylaxis SCD Pressure ulcer(s): N/A GI prophylaxis: PPI Lines: N/A Foley:  Yes, and it is still needed Code Status:  full code Last date of multidisciplinary goals of care discussion: 12/17 updated fiance at bedside   Labs   CBC: Recent Labs  Lab 05/06/23 1621 05/06/23 1632 05/06/23 1726 05/07/23 0259  WBC 10.1  --   --  12.3*  HGB 12.0* 12.9* 12.9* 13.0  HCT 38.8* 38.0* 38.0* 40.5  MCV 90.9  --   --  90.0  PLT 360  --   --  316    Basic Metabolic Panel: Recent Labs  Lab 05/06/23 1621 05/06/23 1632 05/06/23 1726  NA 137 138 135  K 3.4* 3.4* 3.5  CL 98 96*  --   CO2 28  --   --   GLUCOSE 219* 232*  --   BUN 12 14  --   CREATININE 1.17 1.20  --   CALCIUM 8.9  --   --    GFR: Estimated Creatinine Clearance: 100.8 mL/min (by C-G formula based on SCr of 1.2 mg/dL). Recent Labs  Lab 05/06/23 1621 05/06/23 1633 05/07/23 0259  WBC 10.1  --  12.3*  LATICACIDVEN  --  3.0*  --     Liver Function Tests: Recent Labs  Lab 05/06/23 1621  AST 142*  ALT 111*  ALKPHOS 150*  BILITOT 0.8  PROT 7.8  ALBUMIN 3.3*   No results for input(s): "LIPASE", "AMYLASE" in the last 168 hours. No results for input(s): "AMMONIA" in the last 168 hours.  ABG    Component Value Date/Time   PHART 7.397 05/06/2023 1726   PCO2ART 55.4 (H) 05/06/2023 1726   PO2ART 201 (H) 05/06/2023 1726   HCO3 34.6 (H) 05/06/2023 1726   TCO2 36 (H) 05/06/2023 1726   O2SAT 100 05/06/2023 1726     Coagulation  Profile: Recent Labs  Lab 05/06/23 1621  INR 1.2    Cardiac Enzymes: No results for input(s): "CKTOTAL", "CKMB", "CKMBINDEX", "TROPONINI" in the last 168 hours.  HbA1C: Hgb A1c MFr Bld  Date/Time Value Ref Range Status  05/06/2023 08:25 PM 10.0 (H) 4.8 - 5.6 % Final    Comment:    (NOTE) Pre diabetes:          5.7%-6.4%  Diabetes:              >6.4%  Glycemic control for   <7.0% adults with diabetes     CBG: Recent Labs  Lab 05/06/23 1630 05/06/23 1953 05/06/23 2337 05/07/23 0346 05/07/23 0757  GLUCAP 204* 216* 244* 193* 167*    Review of Systems:   Unable to assess   Past Medical History:  He,  has no past medical history on file.  Surgical History:     Social History:      Family History:  His family history is not on file.   Allergies Not on File   Home Medications  Prior to Admission medications   Medication Sig Start Date End Date Taking? Authorizing Provider  acetaminophen-codeine (TYLENOL #3) 300-30 MG tablet Take 1 tablet by mouth every 4 (four) hours as needed. 04/22/23   [provider]  atorvastatin (LIPITOR) 40 MG tablet Take 40 mg by mouth daily. 04/13/23   [provider]  chlorhexidine (PERIDEX) 0.12 % solution Use as directed 15 mLs in the mouth or throat in the morning, at noon, and at bedtime. 04/22/23   [provider]  clonazePAM (KLONOPIN) 0.5 MG tablet Take 0.5 mg by mouth 2 (two) times daily as needed. 04/12/23   [provider]  cloNIDine (CATAPRES) 0.1 MG tablet Take 0.1 mg by mouth 2 (two) times daily as needed. 04/12/23   [provider]  DULoxetine (CYMBALTA) 30 MG capsule Take 30 mg by mouth every morning. 05/01/23   [provider]  ELIQUIS 5 MG TABS tablet Take 5 mg by mouth 2 (two) times daily. 03/18/23   [provider]  fluticasone (FLONASE) 50 MCG/ACT nasal spray Place 2 sprays into both nostrils at bedtime. 04/28/23   [provider]  furosemide  (LASIX) 20 MG tablet Take 20 mg by mouth daily as needed. 04/01/23   [provider]  furosemide (LASIX) 40 MG tablet Take 40 mg by mouth daily as needed. 04/08/23   [provider]  gabapentin (NEURONTIN) 800 MG tablet Take 800 mg by mouth 3 (three) times daily. 04/09/23   [provider]  HUMALOG KWIKPEN 100 UNIT/ML KwikPen Inject 2-10 Units into the skin as directed. 04/08/23   [provider]  hydrochlorothiazide (HYDRODIURIL) 12.5 MG tablet Take 12.5 mg by mouth daily. 04/23/23   [provider]  losartan (COZAAR) 100 MG tablet Take 100 mg by mouth daily. 04/20/23   [provider]  mirtazapine (REMERON) 45 MG tablet Take 45 mg by mouth at bedtime. 05/01/23   [provider]  oxyCODONE-acetaminophen (PERCOCET/ROXICET) 5-325 MG tablet Take 1 tablet by mouth every 6 (six) hours as needed. 04/19/23   [provider]  sildenafil (REVATIO) 20 MG tablet Take 20 mg by mouth as needed. 04/19/23   [provider]  TRULICITY 1.5 MG/0.5ML SOAJ Inject 1.5 mg into the skin once a week. 03/29/23   [provider]  Vitamin D, Ergocalciferol, (DRISDOL) 1.25 MG (50000 UNIT) CAPS capsule Take 50,000 Units by mouth once a week. 04/20/23   [provider]     Critical care time: 40 minutes   JD Daryel [facility] 05/07/2023, 9:48 AM  Please see [email or web address] for pager details.  From 7A-7P if no response, please call [phone]. After hours, please call ELink [phone].

## 2023-05-07 NOTE — Inpatient Diabetes Management (Signed)
Inpatient Diabetes Program Recommendations  AACE/ADA: New Consensus Statement on Inpatient Glycemic Control (2015)  Target Ranges:  Prepandial:   less than 140 mg/dL      Peak postprandial:   less than 180 mg/dL (1-2 hours)      Critically ill patients:  140 - 180 mg/dL   Lab Results  Component Value Date   GLUCAP 167 (H) 05/07/2023   HGBA1C 10.0 (H) 05/06/2023    Review of Glycemic Control  Latest Reference Range & Units 05/06/23 19:53 05/06/23 23:37 05/07/23 03:46 05/07/23 07:57  Glucose-Capillary 70 - 99 mg/dL 191 (H) 478 (H) 295 (H) 167 (H)  (H): Data is abnormally high Diabetes history: Type 2 DM Outpatient Diabetes medications: Humalog 2-10 units TID, Trulicity 1.5 mg qwk per med rec Current orders for Inpatient glycemic control: Novolog 0-15 units Q4H  Inpatient Diabetes Program Recommendations:    Consider adding Semglee 20 units every day.   Thanks, Lujean Rave, MSN, RNC-OB Diabetes Coordinator [phone] (8a-5p)

## 2023-05-07 NOTE — Progress Notes (Signed)
Patient transported from 2H16 to CT and back on ventilator without complication.

## 2023-05-07 NOTE — Plan of Care (Signed)
Patient remains on MCH-2H at time of writing. Patient is opens eyes to voice and is able to blink his eyes and stick out tongue on command, does not exhibit motor movements to BUE or BLE upon command. The patient exhibits just a flicker of gross motor movement in response to BUE and BLE noxious stimuli - quite weak but equal. PERRLA. Plan is for MRI (per orders) overnight tonight, pending MRI availability. This RN placed a phone call to MRI at 2111 hrs and tentatively planned for MRI at 2300 hours. This RN called MRI again at ~ 2230 hours and was informed we would need to delay scan until ~ midnight. Planning for MRI tonight still. This RN also reached out to E-Link/CCM overnight to clarify leftover orders for TTM. Per Dr. Warrick Parisian, no indication to restart TTM (per off-going RN, patient has been off TTM since around 1300 hours today; however, the orders were still in place). This RN did raise the concern of the patient's elevated temp, and new orders were placed by Dr. Warrick Parisian for PRN APAP (no active orders were in place at time of phone call). Also, the patient's admission profile was completed by this RN overnight; including an new education assessment ad activating standing orders for indicated vaccines (these were; however, delayed by a day or two given the patient's delicate clinical state).   Edit: 0200 hours: Patient transported to MRI and back by this RN, Tim (RT), and a patient transporter. No obvious complications before, during, or after transport. Foley catheter removed by this RN prior to transport due to MRI incompatibility.   Edit 0530 hrs: WUA attempted this AM per protocol. Patient's infusions were reduced by 50%; however, within 15 minutes the patient became diaphoretic, exhibiting shivering, was tremulous, and became dyssynchronous with the ventilator. Medications resumed at 100% of prior rate, and PRN medications were given for symptom control, as per orders. WUA failed.  0600 hrs: In-and-out  cath performed X 1 by this RN and Gerilyn Pilgrim, RN. Indeterminate amount in urine in bladder by bladder scan. No UOP since Foley catheter removal. 500 mL clear urine removed from bladder.  Problem: Education: Goal: Ability to describe self-care measures that may prevent or decrease complications (Diabetes Survival Skills Education) will improve Outcome: Not Progressing Goal: Individualized Educational Video(s) Outcome: Not Progressing   Problem: Coping: Goal: Ability to adjust to condition or change in health will improve Outcome: Not Progressing   Problem: Fluid Volume: Goal: Ability to maintain a balanced intake and output will improve Outcome: Not Progressing   Problem: Health Behavior/Discharge Planning: Goal: Ability to identify and utilize available resources and services will improve Outcome: Not Progressing Goal: Ability to manage health-related needs will improve Outcome: Not Progressing   Problem: Metabolic: Goal: Ability to maintain appropriate glucose levels will improve Outcome: Not Progressing   Problem: Nutritional: Goal: Maintenance of adequate nutrition will improve Outcome: Not Progressing Goal: Progress toward achieving an optimal weight will improve Outcome: Not Progressing   Problem: Skin Integrity: Goal: Risk for impaired skin integrity will decrease Outcome: Not Progressing   Problem: Tissue Perfusion: Goal: Adequacy of tissue perfusion will improve Outcome: Not Progressing   Problem: Education: Goal: Ability to manage disease process will improve Outcome: Not Progressing   Problem: Cardiac: Goal: Ability to achieve and maintain adequate cardiopulmonary perfusion will improve Outcome: Not Progressing   Problem: Neurologic: Goal: Promote progressive neurologic recovery Outcome: Not Progressing   Problem: Skin Integrity: Goal: Risk for impaired skin integrity will be minimized. Outcome: Not Progressing  Problem: Education: Goal: Knowledge of  General Education information will improve Description: Including pain rating scale, medication(s)/side effects and non-pharmacologic comfort measures Outcome: Not Progressing   Problem: Health Behavior/Discharge Planning: Goal: Ability to manage health-related needs will improve Outcome: Not Progressing   Problem: Clinical Measurements: Goal: Ability to maintain clinical measurements within normal limits will improve Outcome: Not Progressing Goal: Will remain free from infection Outcome: Not Progressing Goal: Diagnostic test results will improve Outcome: Not Progressing Goal: Respiratory complications will improve Outcome: Not Progressing Goal: Cardiovascular complication will be avoided Outcome: Not Progressing   Problem: Activity: Goal: Risk for activity intolerance will decrease Outcome: Not Progressing   Problem: Nutrition: Goal: Adequate nutrition will be maintained Outcome: Not Progressing   Problem: Coping: Goal: Level of anxiety will decrease Outcome: Not Progressing   Problem: Elimination: Goal: Will not experience complications related to bowel motility Outcome: Not Progressing Goal: Will not experience complications related to urinary retention Outcome: Not Progressing   Problem: Pain Management: Goal: General experience of comfort will improve Outcome: Not Progressing   Problem: Safety: Goal: Ability to remain free from injury will improve Outcome: Not Progressing   Problem: Skin Integrity: Goal: Risk for impaired skin integrity will decrease Outcome: Not Progressing

## 2023-05-07 NOTE — Progress Notes (Signed)
EEG complete - results pending 

## 2023-05-08 DIAGNOSIS — I469 Cardiac arrest, cause unspecified: Secondary | ICD-10-CM | POA: Diagnosis not present

## 2023-05-08 NOTE — Progress Notes (Signed)
Transported patient to MRI while patient was on the ventilator. Patient remained stable during transport and procedure.

## 2023-05-08 NOTE — Inpatient Diabetes Management (Signed)
Inpatient Diabetes Program Recommendations  AACE/ADA: New Consensus Statement on Inpatient Glycemic Control (2015)  Target Ranges:  Prepandial:   less than 140 mg/dL      Peak postprandial:   less than 180 mg/dL (1-2 hours)      Critically ill patients:  140 - 180 mg/dL   Lab Results  Component Value Date   GLUCAP 263 (H) 05/08/2023   HGBA1C 10.0 (H) 05/06/2023    Review of Glycemic Control  Latest Reference Range & Units 05/07/23 19:57 05/07/23 23:33 05/08/23 03:35 05/08/23 08:11 05/08/23 11:27  Glucose-Capillary 70 - 99 mg/dL 782 (H) 956 (H) 213 (H) 256 (H) 263 (H)  (H): Data is abnormally high Diabetes history: Type 2 DM Outpatient Diabetes medications: Humalog 2-10 units TID, Trulicity 1.5 mg qwk per med rec Current orders for Inpatient glycemic control: Novolog 0-20 units Q4H   Inpatient Diabetes Program Recommendations:   Consider: -Semglee 15 units every day -Novolog 3 units Q4H for tube feed coverage (to be stopped or held in the event tube feeds held)  Thanks, Lujean Rave, MSN, RNC-OB Diabetes Coordinator [phone] (8a-5p)

## 2023-05-08 NOTE — Progress Notes (Signed)
NAMESeibert Henderson, MRN:  [id number], DOB:  1966-04-27, LOS: 2 ADMISSION DATE:  05/06/2023, CONSULTATION DATE:  05/06/2023 REFERRING MD:  Dr. Karene Fry - EDP, CHIEF COMPLAINT:  Cardiac arrest    History of Present Illness:  Glenn Henderson is a 57 year old male with past medical history significant for hyperlipidemia, OSA, history of hepatitis, MDD, anxiety, PTSD, prior tobacco abuse, DM2 with peripheral neuropathy and latest A1c 12.4, chronic radicular lumbar pain with chronic pain syndrome, history of DVT and PE on chronic anticoagulation with Eliquis, TBI 2013 with left-sided deficits, history of seizures, abnormal thyroid disorder, who presented to the ED 12/16 via EMS after suffering out-of-hospital cardiac arrest.  Patient required 2 rounds of CPR prior to ROSC. Per EMS patient did hit head when he arrested, arrived in c-collar. On arrival patient was seen hypertensive, mildly hypothermic and tachycardic. Lab work significant for K 3.4, glucose 219, alkaline phosphastat 150, albumin 3.3, AST 142, ALT 111, lactic 3.0, Hgb 12.0. CT cervical spine on admit positive for acute unstable fracture with epidural hemorrhage. PCCM consulted for further assistance and admisison  Pertinent  Medical History  HTN, OSA, diabetes, prior CVA, TBI with hemipparesis, CKD, DVT and PE   Significant Hospital Events: Including procedures, antibiotic start and stop dates in addition to other pertinent events   12/16 admitted post cardiac arrest 12/17 MRI spine known fracture at base of dens w/ minimal displacement; moderate c5-6 foraminal stenosis  Interim History / Subjective:  MRI spine known fracture at base of dens w/ minimal displacement; moderate c5-6 foraminal stenosis MRI head no acute abnormality  On wake up assessment patient w/ body shivering; not as awake as yesterday and not following commands Remains intubated on PRVC  Objective   Blood pressure (!) 149/85, pulse 80, temperature 98.8 F (37.1 C),  temperature source Oral, resp. rate 17, height 6\' 2"  (1.88 m), weight 127.7 kg, SpO2 100%.    Vent Mode: PRVC FiO2 (%):  [40 %] 40 % Set Rate:  [14 bmp] 14 bmp Vt Set:  [650 mL] 650 mL PEEP:  [5 cmH20] 5 cmH20 Plateau Pressure:  [22 cmH20-25 cmH20] 23 cmH20   Intake/Output Summary (Last 24 hours) at 05/08/2023 0706 Last data filed at 05/08/2023 0600 Gross per 24 hour  Intake 3091.74 ml  Output 2245 ml  Net 846.74 ml   Filed Weights   05/06/23 1616 05/07/23 0500 05/08/23 0300  Weight: 127 kg 132.9 kg 127.7 kg    Examination: General:  critically ill appearing on mech vent HEENT: MM pink/moist; ETT in place Neuro: sedate; when attempting to come off sedation patient w/ shivering; not following commands for me today on wake up assessment; withdraw to pain CV: s1s2, RRR, no m/r/g PULM:  dim clear BS bilaterally; on mech vent PRVC GI: soft, bsx4 active  Extremities: warm/dry, BLE edema   CMP and CBC pending this am  Resolved Hospital Problem list     Assessment & Plan:  Out of hospital cardiac arrest -Required 2 round CPR prior to ROSC; CTA chest negative for PE Essential HTN Hyperlipidemia  -echo 12/17 unremarkable P: -cont to monitor in icu w/ continuous telemetry monitoring -trend electrolytes and replete as needed -hold home statin w/ elevated LFTs -hold anti-hypertensive's for now  Respiratory insufficiency secondary to above  Possible Aspiration Hx of OSA Prior tobacco use P: -given patient's mental status will hold on extubation this morning; also awaiting NSG plan moving forward -LTVV strategy with tidal volumes of 6-8 cc/kg ideal body weight -Wean  PEEP/FiO2 for SpO2 >92% -VAP bundle in place -Daily SAT and SBT -PAD protocol in place -wean sedation for RASS goal 0 to -1 -cont unasyn  Unstable C-spine C1-C2 fracture with possible epidural hematoma  Nasal bone fractures TBI 2013 with left-sided deficits Prior CVA, TBI with hemipparesis P: -NSG and  surgery following; appreciate recs -cont c-collar -EEG w/ no seizure -limit sedating meds -Continue neuroprotective measures- normothermia, euglycemia, HOB greater than 30, head in neutral alignment, normocapnia, normoxia -SCDs for dvt ppx -ENT consulted for nasal bone fractures; no plan on intervention  History of DVT and PE on chronic anticoagulation with Eliquis P: -hold AC in setting of epidural hematoma -when to resume AC per neurosurgery  History of hepatitis Elevated LFTs P: -likely shock liver -trend cmp  DM2 with peripheral neuropathy  -12/17 A1c 10 P: -increased SSI  -CBG goal 140-180 -CBG checks q4  Peripheral neuropathy Plan: -hold home Cymbalta, gabapentin for now -consider resuming low dose gaba to avoid withdrawal  Anxiety/depression Plan: -hold klonopin -consider resuming low dose klonopin to avoid withdrawal  Chronic radicular lumbar pain with chronic pain syndrome Plan: -hold home oxy  Best Practice (right click and "Reselect all SmartList Selections" daily)   Diet/type: NPO w/ oral meds DVT prophylaxis SCD Pressure ulcer(s): N/A GI prophylaxis: PPI Lines: N/A Foley:  Yes, and it is still needed Code Status:  full code Last date of multidisciplinary goals of care discussion: 12/18 updated fiance at bedside   Labs   CBC: Recent Labs  Lab 05/06/23 1621 05/06/23 1632 05/06/23 1726 05/07/23 0259  WBC 10.1  --   --  12.3*  HGB 12.0* 12.9* 12.9* 13.0  HCT 38.8* 38.0* 38.0* 40.5  MCV 90.9  --   --  90.0  PLT 360  --   --  316    Basic Metabolic Panel: Recent Labs  Lab 05/06/23 1621 05/06/23 1632 05/06/23 1726 05/07/23 0703 05/08/23 0304  NA 137 138 135 136  --   K 3.4* 3.4* 3.5 3.8  --   CL 98 96*  --  97*  --   CO2 28  --   --  26  --   GLUCOSE 219* 232*  --  169*  --   BUN 12 14  --  10  --   CREATININE 1.17 1.20  --  1.06  --   CALCIUM 8.9  --   --  9.1  --   MG  --   --   --  2.0 2.0  PHOS  --   --   --  3.3 2.9    GFR: Estimated Creatinine Clearance: 111.8 mL/min (by C-G formula based on SCr of 1.06 mg/dL). Recent Labs  Lab 05/06/23 1621 05/06/23 1633 05/07/23 0259  WBC 10.1  --  12.3*  LATICACIDVEN  --  3.0*  --     Liver Function Tests: Recent Labs  Lab 05/06/23 1621 05/07/23 0922  AST 142* 87*  ALT 111* 143*  ALKPHOS 150* 153*  BILITOT 0.8 0.9  PROT 7.8 7.9  ALBUMIN 3.3* 3.3*   No results for input(s): "LIPASE", "AMYLASE" in the last 168 hours. No results for input(s): "AMMONIA" in the last 168 hours.  ABG    Component Value Date/Time   PHART 7.397 05/06/2023 1726   PCO2ART 55.4 (H) 05/06/2023 1726   PO2ART 201 (H) 05/06/2023 1726   HCO3 34.6 (H) 05/06/2023 1726   TCO2 36 (H) 05/06/2023 1726   O2SAT 100 05/06/2023 1726  Coagulation Profile: Recent Labs  Lab 05/06/23 1621  INR 1.2    Cardiac Enzymes: No results for input(s): "CKTOTAL", "CKMB", "CKMBINDEX", "TROPONINI" in the last 168 hours.  HbA1C: Hgb A1c MFr Bld  Date/Time Value Ref Range Status  05/06/2023 08:25 PM 10.0 (H) 4.8 - 5.6 % Final    Comment:    (NOTE) Pre diabetes:          5.7%-6.4%  Diabetes:              >6.4%  Glycemic control for   <7.0% adults with diabetes     CBG: Recent Labs  Lab 05/07/23 1151 05/07/23 1554 05/07/23 1957 05/07/23 2333 05/08/23 0335  GLUCAP 157* 174* 177* 226* 223*    Review of Systems:   Unable to assess   Past Medical History:  He,  has no past medical history on file.   Surgical History:     Social History:   reports that he has been smoking cigarettes. He does not have any smokeless tobacco history on file.   Family History:  His family history is not on file.   Allergies Not on File   Home Medications  Prior to Admission medications   Medication Sig Start Date End Date Taking? Authorizing Provider  acetaminophen-codeine (TYLENOL #3) 300-30 MG tablet Take 1 tablet by mouth every 4 (four) hours as needed. 04/22/23   [provider]  atorvastatin (LIPITOR) 40 MG tablet Take 40 mg by mouth daily. 04/13/23   [provider]  chlorhexidine (PERIDEX) 0.12 % solution Use as directed 15 mLs in the mouth or throat in the morning, at noon, and at bedtime. 04/22/23   [provider]  clonazePAM (KLONOPIN) 0.5 MG tablet Take 0.5 mg by mouth 2 (two) times daily as needed. 04/12/23   [provider]  cloNIDine (CATAPRES) 0.1 MG tablet Take 0.1 mg by mouth 2 (two) times daily as needed. 04/12/23   [provider]  DULoxetine (CYMBALTA) 30 MG capsule Take 30 mg by mouth every morning. 05/01/23   [provider]  ELIQUIS 5 MG TABS tablet Take 5 mg by mouth 2 (two) times daily. 03/18/23   [provider]  fluticasone (FLONASE) 50 MCG/ACT nasal spray Place 2 sprays into both nostrils at bedtime. 04/28/23   [provider]  furosemide (LASIX) 20 MG tablet Take 20 mg by mouth daily as needed. 04/01/23   [provider]  furosemide (LASIX) 40 MG tablet Take 40 mg by mouth daily as needed. 04/08/23   [provider]  gabapentin (NEURONTIN) 800 MG tablet Take 800 mg by mouth 3 (three) times daily. 04/09/23   [provider]  HUMALOG KWIKPEN 100 UNIT/ML KwikPen Inject 2-10 Units into the skin as directed. 04/08/23   [provider]  hydrochlorothiazide (HYDRODIURIL) 12.5 MG tablet Take 12.5 mg by mouth daily. 04/23/23   [provider]  losartan (COZAAR) 100 MG tablet Take 100 mg by mouth daily. 04/20/23   [provider]  mirtazapine (REMERON) 45 MG tablet Take 45 mg by mouth at bedtime. 05/01/23   [provider]  oxyCODONE-acetaminophen (PERCOCET/ROXICET) 5-325 MG tablet Take 1 tablet by mouth every 6 (six) hours as needed. 04/19/23   [provider]  sildenafil (REVATIO) 20 MG tablet Take 20 mg by mouth as needed. 04/19/23   [provider]  TRULICITY 1.5 MG/0.5ML SOAJ Inject 1.5 mg into the  skin once a week. 03/29/23   [provider]  Vitamin D, Ergocalciferol, (  DRISDOL) 1.25 MG (50000 UNIT) CAPS capsule Take 50,000 Units by mouth once a week. 04/20/23   [provider]     Critical care time: 35 minutes   JD Daryel [facility] 05/08/2023, 7:06 AM  Please see [email or web address] for pager details.  From 7A-7P if no response, please call [phone]. After hours, please call ELink [phone].

## 2023-05-08 NOTE — Progress Notes (Signed)
Patient ID: Glenn Henderson, male   DOB: November 23, 1965, 57 y.o.   MRN: [id number] I have reviewed the MRI of the cervical spine with Dr. Simonne Come.  Contrary to the report I do believe there is cord signal change at the C1-C2 junction with edema more on the left side.  This would suggest that he had contused his cord at the time of the injury.  The canal is adequately decompressed in the present state and present position however given the nature of the cord injury this suggests an unstable fracture.  For now I believe maintaining him in a cervical collar is the best treatment however I do believe he will require surgery to stabilize the occiput to the C2 level.  We can make arrangements for this if and when he is stable from a cardiovascular standpoint.

## 2023-05-08 NOTE — Procedures (Addendum)
Cortrak  Person Inserting Tube:  Misti Towle T, RD Tube Type:  Cortrak - 43 inches Tube Size:  10 Tube Location:  Right nare Secured by: Bridle Technique Used to Measure Tube Placement:  Marking at nare/corner of mouth Cortrak Secured At:  73 cm   Cortrak Tube Team Note:  Consult received to place a Cortrak feeding tube.  Patient noted to have nasal bone fractures. Per discussion with CCM attending Dr. Merrily Pew, discussed with ENT and okay to place Cortrak.  X-ray is required, abdominal x-ray has been ordered by the Cortrak team. Please confirm tube placement before using the Cortrak tube.   If the tube becomes dislodged please keep the tube and contact the Cortrak team at [email or web address] for replacement.  If after hours and replacement cannot be delayed, place a NG tube and confirm placement with an abdominal x-ray.    Shelle Iron RD, LDN Contact via [employer].

## 2023-05-08 NOTE — TOC Initial Note (Signed)
Transition of Care [facility]) - Initial/Assessment Note    Patient Details  Name: Glenn Henderson MRN: [id number] Date of Birth: [birth year]  [facility]) CM/SW Contact:    Gala Lewandowsky, RN Phone Number: 05/08/2023, 12:12 PM  Clinical Narrative: Patient presented post PEA arrest. Patient was discussed in progression rounds and family not at the bedside during rounds. Case Manager called patients mother Glenn Henderson and she reported that the patient was independent from home alone. Patient had personal care aide services 7 hours a day, 7 days a week. Patients mother was unsure of the agency name. Patient currently intubated. Case Manager will continue to follow for transition of care needs.                  Expected Discharge Plan:  (TBD)  Expected Discharge Plan and Services   Discharge Planning Services: CM Consult   Living arrangements for the past 2 months: Single Family Home   Prior Living Arrangements/Services Living arrangements for the past 2 months: Single Family Home Lives with:: Self Patient language and need for interpreter reviewed::  (intubated- spoke with Mother)        Need for Family Participation in Patient Care: Yes (Comment) Care giver support system in place?: Yes (comment) Current home services: Homehealth aide Criminal Activity/Legal Involvement Pertinent to Current Situation/Hospitalization: No - Comment as needed  Activities of Daily Living   ADL Screening (condition at time of admission) Independently performs ADLs?: No Does the patient have a NEW difficulty with bathing/dressing/toileting/self-feeding that is expected to last >3 days?: Yes (Initiates electronic notice to provider for possible OT consult) Does the patient have a NEW difficulty with getting in/out of bed, walking, or climbing stairs that is expected to last >3 days?: Yes (Initiates electronic notice to provider for possible PT consult) Does the patient have a NEW difficulty with  communication that is expected to last >3 days?: Yes (Initiates electronic notice to provider for possible SLP consult) Is the patient deaf or have difficulty hearing?: No Does the patient have difficulty seeing, even when wearing glasses/contacts?: No Does the patient have difficulty concentrating, remembering, or making decisions?: Yes  Emotional Assessment Appearance:: Appears stated age Attitude/Demeanor/Rapport: Unable to Assess Affect (typically observed): Unable to Assess   Alcohol / Substance Use: Not Applicable Psych Involvement: No (comment)  Admission diagnosis:  Cardiac arrest (HCC) [I46.9] SDH (subdural hematoma) (HCC) [S06.5XAA] Closed fracture of nasal bone, initial encounter [S02.2XXA] Closed nondisplaced fracture of first cervical vertebra, unspecified fracture morphology, initial encounter (HCC) [S12.001A] Closed odontoid fracture, initial encounter [facility]) [S12.100A] Patient Active Problem List   Diagnosis Date Noted   Closed fracture of nasal bones 05/07/2023   Cardiac arrest (HCC) 05/06/2023   PCP:  Fleet Contras, MD Pharmacy:  No Pharmacies Listed  Social Drivers of Health (SDOH) Social History: SDOH Screenings   Food Insecurity: Patient Unable To Answer (05/07/2023)  Housing: Patient Unable To Answer (05/07/2023)  Transportation Needs: Patient Unable To Answer (05/07/2023)  Utilities: Patient Unable To Answer (05/07/2023)  Tobacco Use: High Risk (05/07/2023)   Readmission Risk Interventions     No data to display

## 2023-05-09 DIAGNOSIS — E119 Type 2 diabetes mellitus without complications: Secondary | ICD-10-CM

## 2023-05-09 DIAGNOSIS — S12100A Unspecified displaced fracture of second cervical vertebra, initial encounter for closed fracture: Secondary | ICD-10-CM

## 2023-05-09 DIAGNOSIS — F17210 Nicotine dependence, cigarettes, uncomplicated: Secondary | ICD-10-CM

## 2023-05-09 DIAGNOSIS — I469 Cardiac arrest, cause unspecified: Secondary | ICD-10-CM | POA: Diagnosis not present

## 2023-05-09 DIAGNOSIS — I10 Essential (primary) hypertension: Secondary | ICD-10-CM

## 2023-05-09 HISTORY — PX: ODONTOID SCREW INSERTION: SHX5704

## 2023-05-09 MED ORDER — INSULIN GLARGINE-YFGN 100 UNIT/ML ~~LOC~~ SOLN
12.0000 [IU] | Freq: Two times a day (BID) | SUBCUTANEOUS | Status: DC
Start: 2023-05-09 — End: 2023-05-10

## 2023-05-09 NOTE — Anesthesia Procedure Notes (Signed)
Arterial Line Insertion Start/End12/19/2024 7:45 PM, 05/09/2023 7:55 PM Performed by: [name], MD  Patient location: Pre-op. Preanesthetic checklist: patient identified, IV checked, site marked, risks and benefits discussed, surgical consent, monitors and equipment checked, pre-op evaluation, timeout performed and anesthesia consent Lidocaine 1% used for infiltration Right, radial was placed Catheter size: 20 G Hand hygiene performed , maximum sterile barriers used  and Seldinger technique used  Attempts: 1 Procedure performed without using ultrasound guided technique. Following insertion, dressing applied and Biopatch. Post procedure assessment: normal and unchanged  Patient tolerated the procedure well with no immediate complications.

## 2023-05-09 NOTE — Op Note (Signed)
Date of surgery: 19 2024 Preoperative diagnosis: C1 Jefferson fracture, type II odontoid fracture Postoperative diagnosis: Same Procedure: Odontoid screw fixation with fluoroscopic guidance Surgeon: Barnett Abu Anesthesia: General endotracheal Indications: Glenn Henderson is a 57 year old male who sustained a type II odontoid fracture with Midence of cord contusion.  He has a Jefferson fracture also this occurred when he fell and developed a cardiac arrest.  Patient has been resuscitated and because of the evidence of the cord contusion and odontoid screws being placed to stabilize the C2 fracture secondary to his cord injury.  Procedure: Patient was brought to the operating room supine on the stretcher.  He has already been intubated.  The patient was positioned on the operating table and a doughnut with a roll placed under the chest and effort to get some extension.  After checking the AP and lateral fluoroscopy's for the alignment the patient's neck was prepped with alcohol and DuraPrep and draped in a sterile fashion.  An incision was made around the level of C7 inferiorly and dissection was carried down through the platysma the plane between the sternocleidomastoid and strap muscles was dissected bluntly to the prevertebral space was reached as high in the cervical spine.  Then by placing a probe over the ventral aspect of C2 and guiding it to the midline and checking position on AP and lateral projection we then passed a K wire through the guide into the vertebral body of C2 across the fracture fragment into the posterior aspect of the odontoid.  This appeared to go quite well and then we measured the screw length which was estimated at 38 mm.  Because there will be some lagging effect we chose a shorter screw of 36 mm.  Then over the K wire we drilled the fracture fragments.  The fracture drill was removed with a K wire in place we then tapped threads into the fracture fragment.  A 36 mm lag screw was  then chosen and placed over the K wire and lag effect was produced.  On final examination it was felt that the screw did seem to angle back posteriorly and was concerned for the position of the screw possibly outside the odontoid.  The screw was in tight already and lagged well it was decided to close the incision at this point and obtain a CT scan to check final positioning.  There is difficult to view the patient this posterior aspect of the C2 vertebral body secondary to his plethoric nature the incision was closed with 3-0 and 4-0 Vicryl in interrupted fashion Dermabond was placed on the skin a soft collar was placed and the patient was taken to CT scanning for further postoperative check.

## 2023-05-09 NOTE — Progress Notes (Signed)
Patient ID: Glenn Henderson, male   DOB: 1965-11-03, 56 y.o.   MRN: [id number] Patient remains on ventilator.  When he is allowed to awaken he moves his right side more than his left.  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 and is gradually gotten motor function back but she notes that he was always numb on the left side I noted on the MRI the changes in his cord and what I believed to be cord contusion or this represents an old injury is of concern however given the fact that he has this fracture that is likely unstable some stability needs to be rendered in that regard I believe that we can obtain reasonable fixation of the odontoid portion of the injury with an odontoid screw I discussed the rationale for this technique with the patient's mother and explained the nature of the procedure she is agreeable to proceeding and we will plan on scheduling it for this evening.  Thereafter I believe the patient can start increasing mobilization and weaning from the ventilator.

## 2023-05-09 NOTE — Transfer of Care (Signed)
Immediate Anesthesia Transfer of Care Note  Patient: Glenn Henderson  Procedure(s) Performed: ODONTOID SCREW FIXATION WITH FLOUROSCOPIC GUIDANCE (Neck)  Patient Location: SICU  Anesthesia Type:General  Level of Consciousness: Patient remains intubated per anesthesia plan  Airway & Oxygen Therapy: Patient remains intubated per anesthesia plan  Post-op Assessment: Report given to RN and Post -op Vital signs reviewed and stable  Post vital signs: Reviewed and stable  Last Vitals:  Vitals Value Taken Time  BP    Temp    Pulse    Resp    SpO2      Last Pain:  Vitals:   05/09/23 1616  TempSrc: Axillary         Complications: No notable events documented.

## 2023-05-09 NOTE — Transfer of Care (Deleted)
Immediate Anesthesia Transfer of Care Note  Patient: Glenn Henderson  Procedure(s) Performed: ODONTOID SCREW INSERTION (Neck)  Patient Location: SICU  Anesthesia Type:General  Level of Consciousness: Patient remains intubated per anesthesia plan  Airway & Oxygen Therapy: Patient remains intubated per anesthesia plan  Post-op Assessment: Report given to RN and Post -op Vital signs reviewed and stable  Post vital signs: Reviewed and stable  Last Vitals:  Vitals Value Taken Time  BP    Temp    Pulse    Resp    SpO2      Last Pain:  Vitals:   05/09/23 1616  TempSrc: Axillary         Complications: No notable events documented.

## 2023-05-09 NOTE — Op Note (Signed)
Date of surgery: 05/09/2023 Preoperative diagnosis: Type II odontoid fracture status post odontoid screw fixation with malposition of screw Postoperative diagnosis: Same Procedure: Replacement of odontoid screw Surgeon: Barnett Abu Anesthesia: General Endotracheal Indications: Mr. Sophia Braford had an anterior deltoid screw fixation to stabilize and odontoid fracture.  The screw length was judged appropriately however upon inserting the screw in the lagging it to the fractured fragment it appeared that the screw tilted posterior and was perhaps too long this was noted in the operating room however dimensions of the odontoid were very difficult to visualize on the postoperative imaging and the patient was taken from the operating room to CT scan for postoperative CT scan.  It was noted that the screw protruded into the canal and the tip of the odontoid posteriorly by about 8 mm.  There was good purchase and compression of the fractured fragment.  The patient was then returned to the operating room and incision was cleansed with alcohol and DuraPrep after removing the Dermabond glue the sutures were removed and then upon exploration identified the screw head and the screw was removed.  It was felt that a 28 mm length screw would fit well however was uncertain that this would purchase the distal portion of the odontoid a Synthes screw measuring 4 mm in diameter by 28 mm in length was then inserted and indeed there was capture of the distal portion of the odontoid with a lag effect.  The screw was left in place.  Hemostasis in the soft tissues was checked and the wound was closed with 3-0 Vicryl in interrupted fashion and surgical staples were placed on the skin blood loss for the procedure was estimated 20 cc.  Patient was then returned to the ICU in stable condition with a soft collar in place

## 2023-05-09 NOTE — Anesthesia Preprocedure Evaluation (Addendum)
Anesthesia Evaluation  Patient identified by MRN, date of birth, ID band Patient unresponsive    Reviewed: Allergy & Precautions, Patient's Chart, lab work & pertinent test results  Airway Mallampati: Intubated       Dental   Pulmonary Current Smoker      + intubated    Cardiovascular hypertension, Pt. on medications  Rhythm:Regular Rate:Tachycardia  Echo 04/2023  1. Left ventricular ejection fraction, by estimation, is 60 to 65%. The left ventricle has normal function. The left ventricle has no regional wall motion abnormalities. There is mild concentric left ventricular hypertrophy. Left ventricular diastolic parameters were normal.   2. Right ventricular systolic function is normal. The right ventricular size is normal.   3. The mitral valve is normal in structure. Trivial mitral valve regurgitation. No evidence of mitral stenosis.   4. The aortic valve is normal in structure. Aortic valve regurgitation is not visualized. No aortic stenosis is present.   5. The inferior vena cava is dilated in size with <50% respiratory variability, suggesting right atrial pressure of 15 mmHg.   Echo 02/2023  1. Left ventricular ejection fraction, by estimation, is 60 to 65%. The left ventricle has normal function. The left ventricle has no regional wall motion abnormalities. Left ventricular diastolic parameters were normal.   2. Right ventricular systolic function is normal. The right ventricular size is normal.   3. The mitral valve is normal in structure. No evidence of mitral valve regurgitation. No evidence of mitral stenosis.   4. The aortic valve is normal in structure. Aortic valve regurgitation is not visualized. No aortic stenosis is present.   5. The inferior vena cava is normal in size with <50% respiratory variability, suggesting right atrial pressure of 8 mmHg.   6. Agitated saline contrast bubble study was negative, with no evidence of  any interatrial shunt.       Neuro/Psych negative neurological ROS     GI/Hepatic negative GI ROS, Neg liver ROS,,,  Endo/Other  negative endocrine ROS    Renal/GU negative Renal ROS     Musculoskeletal negative musculoskeletal ROS (+)    Abdominal  (+) + obese  Peds  Hematology negative hematology ROS (+)   Anesthesia Other Findings   Reproductive/Obstetrics                             Anesthesia Physical Anesthesia Plan  ASA: 3  Anesthesia Plan: General   Post-op Pain Management: Ofirmev IV (intra-op)*   Induction: Intravenous  PONV Risk Score and Plan: 2 and Ondansetron, Dexamethasone and Treatment may vary due to age or medical condition  Airway Management Planned: Oral ETT  Additional Equipment: Arterial line  Intra-op Plan:   Post-operative Plan: Post-operative intubation/ventilation  Informed Consent: I have reviewed the patients History and Physical, chart, labs and discussed the procedure including the risks, benefits and alternatives for the proposed anesthesia with the patient or authorized representative who has indicated his/her understanding and acceptance.     Dental advisory given and Consent reviewed with POA  Plan Discussed with: CRNA  Anesthesia Plan Comments:         Anesthesia Quick Evaluation

## 2023-05-09 NOTE — Progress Notes (Signed)
Pt. Will go to CT before being taken to ICU.

## 2023-05-09 NOTE — Progress Notes (Signed)
NAMECharley Henderson, MRN:  [id number], DOB:  Aug 22, 1965, LOS: 3 ADMISSION DATE:  05/06/2023, CONSULTATION DATE:  05/06/2023 REFERRING MD:  Dr. Karene Fry - EDP, CHIEF COMPLAINT:  Cardiac arrest    History of Present Illness:  57 year old male with past medical history significant for hyperlipidemia, OSA, history of hepatitis, MDD, anxiety, PTSD, prior tobacco abuse, DM2 with peripheral neuropathy and latest A1c 12.4, chronic radicular lumbar pain with chronic pain syndrome, history of DVT and PE on chronic anticoagulation with Eliquis, TBI 2013 with left-sided deficits, history of seizures, abnormal thyroid disorder, who presented to the ED 12/16 via EMS after suffering out-of-hospital cardiac arrest.  Patient required 2 rounds of CPR prior to ROSC. Per EMS patient did hit head when he arrested, arrived in c-collar. On arrival patient was seen hypertensive, mildly hypothermic and tachycardic. Lab work significant for K 3.4, glucose 219, alkaline phosphastat 150, albumin 3.3, AST 142, ALT 111, lactic 3.0, Hgb 12.0. CT cervical spine on admit positive for acute unstable fracture with epidural hemorrhage. PCCM consulted for further assistance and admisison  Pertinent  Medical History  HTN, OSA, diabetes, prior CVA, TBI with hemipparesis, CKD, DVT and PE   Significant Hospital Events: Including procedures, antibiotic start and stop dates in addition to other pertinent events   12/16 admitted post cardiac arrest 12/17 MRI spine known fracture at base of dens with cord edema, unstable fracture.  12/18: neurosurgery rec surgical stabilization of occiput to C2. Recommending to keep intubated/sedated prior to surgical fixation.  12/19: NAEON. Pending NSGY intervention. Deep sedation.   Interim History / Subjective:  Patient unable to participate in the subjective portion of the exam. He is intubated, deep sedation on propofol and fentanyl. In C-collar. Plan to operate per nsgy and want to keep him intubated,  sedated to avoid any cervical spine movement prior to intervention. Minimal vent settings.   Objective   Blood pressure 120/74, pulse 92, temperature (!) 100.8 F (38.2 C), temperature source Rectal, resp. rate 14, height 6\' 2"  (1.88 m), weight 131 kg, SpO2 97%.    Vent Mode: PRVC FiO2 (%):  [40 %] 40 % Set Rate:  [14 bmp] 14 bmp Vt Set:  [650 mL] 650 mL PEEP:  [5 cmH20] 5 cmH20 Plateau Pressure:  [22 cmH20-26 cmH20] 22 cmH20   Intake/Output Summary (Last 24 hours) at 05/09/2023 0816 Last data filed at 05/09/2023 0700 Gross per 24 hour  Intake 3422.72 ml  Output 2405 ml  Net 1017.72 ml   Filed Weights   05/07/23 0500 05/08/23 0300 05/09/23 0500  Weight: 132.9 kg 127.7 kg 131 kg    Examination: General:  middle aged male, laying in bed, no acute distress, intubated, sedated  HEENT: dried blood to scalp, c-collar, pupils sluggish but equal and reactive, mucous membranes moist, ETT, OGT Neuro: sedated, flickers eyes to painful stimulus. Not following commands.  CV: s1s2, regular rate and rhythm. No appreciable murmur, rub, gallop  PULM: PRVC 40%, 5PEEP. Plat 22, peaks 28, autopeep 5 GI: rounded, soft, non-distended  Extremities: warm, bilateral lower extremity pitting edema   Resolved Hospital Problem list    Assessment & Plan:  PEA cardiac arrest; unclear etiology. Required 2 rounds CPR. CTA negative for PE. Drug screen negative. Query if he fell, c-spine injury, neurogenic shock and arrested.  Essential HTN Hyperlipidemia  -echo 12/17 unremarkable - eeg without seizure activity, severe diffuse encephalopathy  -cont tele monitoring  -trend electrolytes and replete as needed -hold home statin w/ elevated LFTs -hold anti-hypertensive's for  now  Respiratory insufficiency secondary to above  Possible Aspiration Hx of OSA Prior tobacco use - remain intubated until post-operative course per nsgy  - con't unasyn for aspiration coverage - LTVV strategy with tidal volumes of  6-8 cc/kg ideal body weight - wean PEEP/FiO2 for SpO2 >92% - on minimal settings - VAP and PAD protocol in place  - hold on SBT until post-operative  - maintain peak/plats <30, driving pressures <13    Unstable C-spine C1-C2 fracture with possible epidural hematoma  Nasal bone fractures TBI 2013 with left-sided deficits Prior CVA, TBI with hemipparesis - nsgy with plan to intervene on unstable c-spine - con't rigid c-collar  -Continue neuroprotective measures- normothermia, euglycemia, HOB greater than 30, head in neutral alignment, normocapnia, normoxia - scds - ent seen, no intervention for nasal fx   History of DVT and PE on chronic anticoagulation with Eliquis P: - hold AC in setting of epidural hematoma - when to resume AC per neurosurgery  History of hepatitis Elevated LFTs -likely shock liver, improving  -trend cmp  DM2 with peripheral neuropathy; A1c 10 - SSI  - semglee 15 U daily  - cbg q4h, goal 140-180  Peripheral neuropathy -hold home Cymbalta, gabapentin for now -consider resuming low dose gaba to avoid withdrawal  Anxiety/depression -hold klonopin -consider resuming low dose klonopin to avoid withdrawal  Chronic radicular lumbar pain with chronic pain syndrome -hold home oxy, on fentanyl   Best Practice (right click and "Reselect all SmartList Selections" daily)   Diet/type: NPO w/ meds via tube DVT prophylaxis SCD Pressure ulcer(s): N/A GI prophylaxis: H2B Lines: N/A Foley:  Yes, and it is still needed Code Status:  full code Last date of multidisciplinary goals of care discussion: 12/18 updated fiance at bedside   Labs   CBC: Recent Labs  Lab 05/06/23 1621 05/06/23 1632 05/06/23 1726 05/07/23 0259 05/08/23 0831  WBC 10.1  --   --  12.3* 10.7*  HGB 12.0* 12.9* 12.9* 13.0 12.0*  HCT 38.8* 38.0* 38.0* 40.5 37.1*  MCV 90.9  --   --  90.0 87.9  PLT 360  --   --  316 368    Basic Metabolic Panel: Recent Labs  Lab 05/06/23 1621  05/06/23 1632 05/06/23 1726 05/07/23 0703 05/08/23 0304 05/08/23 0831 05/08/23 1733  NA 137 138 135 136  --  135  --   K 3.4* 3.4* 3.5 3.8  --  3.5  --   CL 98 96*  --  97*  --  98  --   CO2 28  --   --  26  --  29  --   GLUCOSE 219* 232*  --  169*  --  258*  --   BUN 12 14  --  10  --  10  --   CREATININE 1.17 1.20  --  1.06  --  1.03  --   CALCIUM 8.9  --   --  9.1  --  8.8*  --   MG  --   --   --  2.0 2.0  --  2.0  PHOS  --   --   --  3.3 2.9  --  3.5   GFR: Estimated Creatinine Clearance: 116.6 mL/min (by C-G formula based on SCr of 1.03 mg/dL). Recent Labs  Lab 05/06/23 1621 05/06/23 1633 05/07/23 0259 05/08/23 0831  WBC 10.1  --  12.3* 10.7*  LATICACIDVEN  --  3.0*  --   --  Liver Function Tests: Recent Labs  Lab 05/06/23 1621 05/07/23 0922 05/08/23 0831  AST 142* 87* 41  ALT 111* 143* 100*  ALKPHOS 150* 153* 133*  BILITOT 0.8 0.9 0.5  PROT 7.8 7.9 7.4  ALBUMIN 3.3* 3.3* 3.0*   No results for input(s): "LIPASE", "AMYLASE" in the last 168 hours. No results for input(s): "AMMONIA" in the last 168 hours.  ABG    Component Value Date/Time   PHART 7.397 05/06/2023 1726   PCO2ART 55.4 (H) 05/06/2023 1726   PO2ART 201 (H) 05/06/2023 1726   HCO3 34.6 (H) 05/06/2023 1726   TCO2 36 (H) 05/06/2023 1726   O2SAT 100 05/06/2023 1726     Coagulation Profile: Recent Labs  Lab 05/06/23 1621  INR 1.2    Cardiac Enzymes: No results for input(s): "CKTOTAL", "CKMB", "CKMBINDEX", "TROPONINI" in the last 168 hours.  HbA1C: Hgb A1c MFr Bld  Date/Time Value Ref Range Status  05/06/2023 08:25 PM 10.0 (H) 4.8 - 5.6 % Final    Comment:    (NOTE) Pre diabetes:          5.7%-6.4%  Diabetes:              >6.4%  Glycemic control for   <7.0% adults with diabetes     CBG: Recent Labs  Lab 05/08/23 1127 05/08/23 1600 05/08/23 1946 05/08/23 2347 05/09/23 0344  GLUCAP 263* 199* 319* 191* 254*    Review of Systems:   As above   Past Medical History:   He,  has no past medical history on file.   Surgical History:     Social History:   reports that he has been smoking cigarettes. He does not have any smokeless tobacco history on file.   Family History:  His family history is not on file.   Allergies Allergies  Allergen Reactions   Tape Other (See Comments)    Blisters     Home Medications  Prior to Admission medications   Medication Sig Start Date End Date Taking? Authorizing Provider  acetaminophen-codeine (TYLENOL #3) 300-30 MG tablet Take 1 tablet by mouth every 4 (four) hours as needed. 04/22/23   [provider]  atorvastatin (LIPITOR) 40 MG tablet Take 40 mg by mouth daily. 04/13/23   [provider]  chlorhexidine (PERIDEX) 0.12 % solution Use as directed 15 mLs in the mouth or throat in the morning, at noon, and at bedtime. 04/22/23   [provider]  clonazePAM (KLONOPIN) 0.5 MG tablet Take 0.5 mg by mouth 2 (two) times daily as needed. 04/12/23   [provider]  cloNIDine (CATAPRES) 0.1 MG tablet Take 0.1 mg by mouth 2 (two) times daily as needed. 04/12/23   [provider]  DULoxetine (CYMBALTA) 30 MG capsule Take 30 mg by mouth every morning. 05/01/23   [provider]  ELIQUIS 5 MG TABS tablet Take 5 mg by mouth 2 (two) times daily. 03/18/23   [provider]  fluticasone (FLONASE) 50 MCG/ACT nasal spray Place 2 sprays into both nostrils at bedtime. 04/28/23   [provider]  furosemide (LASIX) 20 MG tablet Take 20 mg by mouth daily as needed. 04/01/23   [provider]  furosemide (LASIX) 40 MG tablet Take 40 mg by mouth daily as needed. 04/08/23   [provider]  gabapentin (NEURONTIN) 800 MG tablet Take 800 mg by mouth 3 (three) times daily. 04/09/23   [provider]  HUMALOG KWIKPEN 100 UNIT/ML KwikPen Inject 2-10 Units into the skin  as directed. 04/08/23   [provider]  hydrochlorothiazide (HYDRODIURIL)  12.5 MG tablet Take 12.5 mg by mouth daily. 04/23/23   [provider]  losartan (COZAAR) 100 MG tablet Take 100 mg by mouth daily. 04/20/23   [provider]  mirtazapine (REMERON) 45 MG tablet Take 45 mg by mouth at bedtime. 05/01/23   [provider]  oxyCODONE-acetaminophen (PERCOCET/ROXICET) 5-325 MG tablet Take 1 tablet by mouth every 6 (six) hours as needed. 04/19/23   [provider]  sildenafil (REVATIO) 20 MG tablet Take 20 mg by mouth as needed. 04/19/23   [provider]  TRULICITY 1.5 MG/0.5ML SOAJ Inject 1.5 mg into the skin once a week. 03/29/23   [provider]  Vitamin D, Ergocalciferol, (DRISDOL) 1.25 MG (50000 UNIT) CAPS capsule Take 50,000 Units by mouth once a week. 04/20/23   [provider]     Critical care time: 35 minutes   Cristopher Peru, PA-C [facility] 05/09/23 11:03 AM  Please see [email or web address] for pager details.  From 7A-7P if no response, please call [phone] After hours, please call ELink [phone]

## 2023-05-10 DIAGNOSIS — I469 Cardiac arrest, cause unspecified: Secondary | ICD-10-CM | POA: Diagnosis not present

## 2023-05-10 MED ORDER — ATORVASTATIN CALCIUM 40 MG PO TABS
40.0000 mg | ORAL_TABLET | Freq: Every day | ORAL | Status: DC
Start: 2023-05-10 — End: 2023-05-14

## 2023-05-10 MED ORDER — CLONAZEPAM 1 MG PO TABS
1.0000 mg | ORAL_TABLET | Freq: Three times a day (TID) | ORAL | Status: DC
Start: 2023-05-10 — End: 2023-05-14

## 2023-05-10 MED ORDER — CLONAZEPAM 0.5 MG PO TABS
0.5000 mg | ORAL_TABLET | Freq: Three times a day (TID) | ORAL | Status: DC
Start: 2023-05-10 — End: 2023-05-10

## 2023-05-10 NOTE — Progress Notes (Signed)
eLink Physician-Brief Progress Note Patient Name: Glenn Henderson DOB: [birth year] MRN: [id number]   Date of Service  05/10/2023  HPI/Events of Note  Radiology called with abnormal C-Spine imaging findings post-operatively, they are recommending additional imaging.  eICU Interventions  Bedside RN instructed to call Neurosurgeon with report so that he can review the images and determine what additional imaging vs other intervention may be appropriate.        Vaidehi Braddy U Devion Chriscoe 05/10/2023, 2:00 AM

## 2023-05-10 NOTE — Anesthesia Postprocedure Evaluation (Signed)
Anesthesia Post Note  Patient: Glenn Henderson  Procedure(s) Performed: ODONTOID SCREW FIXATION WITH FLOUROSCOPIC GUIDANCE (Neck)     Patient location during evaluation: PACU Anesthesia Type: General Level of consciousness: sedated and patient cooperative Pain management: pain level controlled Vital Signs Assessment: post-procedure vital signs reviewed and stable Respiratory status: spontaneous breathing Cardiovascular status: stable Anesthetic complications: no   No notable events documented.  Last Vitals:  Vitals:   05/10/23 0600 05/10/23 0700  BP: (!) 142/78 125/68  Pulse: 100 (!) 108  Resp: 14 14  Temp:    SpO2: 100% 99%    Last Pain:  Vitals:   05/10/23 0400  TempSrc: Rectal                 [name]

## 2023-05-10 NOTE — Progress Notes (Signed)
Propofol titirated off and precedex titrated up in effort to exchange the propfol for precedex for a RASS -2,-3

## 2023-05-10 NOTE — Progress Notes (Addendum)
Pharmacy Antibiotic Note  Glenn Henderson is a 57 y.o. male admitted on 05/06/2023 with aspiration pneumonia s/p cardiac arrest.  He is on Unasyn and plans are to change to Zosyn (concern of worsening PNA)  -WBC= 12.8, tmax= 100.7, CrCl > 100   Plan: Zosyn 3.375gm Monitor daily CBC, temp, SCr, and for clinical signs of improvement    Height: 6\' 2"  (188 cm) Weight: 130 kg (286 lb 9.6 oz) IBW/kg (Calculated) : 82.2  Temp (24hrs), Avg:100.3 F (37.9 C), Min:99.3 F (37.4 C), Max:100.7 F (38.2 C)  Recent Labs  Lab 05/06/23 1621 05/06/23 1632 05/06/23 1633 05/07/23 0259 05/07/23 0703 05/08/23 0831 05/09/23 0845 05/10/23 0212  WBC 10.1  --   --  12.3*  --  10.7* 11.5* 12.8*  CREATININE 1.17 1.20  --   --  1.06 1.03 0.98 0.99  LATICACIDVEN  --   --  3.0*  --   --   --   --   --     Estimated Creatinine Clearance: 120.8 mL/min (by C-G formula based on SCr of 0.99 mg/dL).    Allergies  Allergen Reactions   Tape Other (See Comments)    Blisters    Antimicrobials this admission: Unasyn 12/16 >> 12/20 Zosyn 12/20  Dose adjustments this admission: N/A  Microbiology results: None  Thank you for allowing pharmacy to be a part of this patient's care.  Harland German, PharmD Clinical Pharmacist **Pharmacist phone directory can now be found on [email or web address] ([email or web address]).  Listed under [facility].

## 2023-05-10 NOTE — Plan of Care (Signed)
Spoke with Esperanza Richters MD on the phone, notified her of an abnormality found by radiology on CT scan taken ~0100 tonight. Asked her to take a look at the scan.

## 2023-05-10 NOTE — Progress Notes (Signed)
Orthopedic Tech Progress Note Patient Details:  Glenn Henderson April 22, 1966 [id number]  Pt already had soft collar on  Patient ID: Glenn Henderson, male   DOB: June 21, 1965, 57 y.o.   MRN: [id number]  [name] 05/10/2023, 12:56 AM

## 2023-05-10 NOTE — Progress Notes (Signed)
NAMEChigozie Henderson, MRN:  [id number], DOB:  Jul 16, 1965, LOS: 4 ADMISSION DATE:  05/06/2023, CONSULTATION DATE:  05/06/2023 REFERRING MD:  Dr. Karene Fry - EDP, CHIEF COMPLAINT:  Cardiac arrest    History of Present Illness:  57 year old male with past medical history significant for hyperlipidemia, OSA, history of hepatitis, MDD, anxiety, PTSD, prior tobacco abuse, DM2 with peripheral neuropathy and latest A1c 12.4, chronic radicular lumbar pain with chronic pain syndrome, history of DVT and PE on chronic anticoagulation with Eliquis, TBI 2013 with left-sided deficits, history of seizures, abnormal thyroid disorder, who presented to the ED 12/16 via EMS after suffering out-of-hospital cardiac arrest.  Patient required 2 rounds of CPR prior to ROSC. Per EMS patient did hit head when he arrested, arrived in c-collar. On arrival patient was seen hypertensive, mildly hypothermic and tachycardic. Lab work significant for K 3.4, glucose 219, alkaline phosphastat 150, albumin 3.3, AST 142, ALT 111, lactic 3.0, Hgb 12.0. CT cervical spine on admit positive for acute unstable fracture with epidural hemorrhage. PCCM consulted for further assistance and admisison  Pertinent  Medical History  HTN, OSA, diabetes, prior CVA, TBI with hemipparesis, CKD, DVT and PE   Significant Hospital Events: Including procedures, antibiotic start and stop dates in addition to other pertinent events   12/16 admitted post cardiac arrest 12/17 MRI spine known fracture at base of dens with cord edema, unstable fracture.  12/18: neurosurgery rec surgical stabilization of occiput to C2. Recommending to keep intubated/sedated prior to surgical fixation.  12/19: NAEON. Pending NSGY intervention. Deep sedation.   Interim History / Subjective:  S/p C-spine fixation/ stabilization overnight w/NSGY, required return trip to OR to fix malposition of odontoid screw - poorly controlled pain/ worsening anxiety today on fentanyl gtt, propofol  weaned off  - PSV trials started, tolerated x 2hrs prior to increased WOB  Objective   Blood pressure (!) 165/83, pulse (!) 110, temperature (!) 100.7 F (38.2 C), temperature source Oral, resp. rate (!) 33, height 6\' 2"  (1.88 m), weight 130 kg, SpO2 96%.    Vent Mode: PSV;CPAP FiO2 (%):  [40 %-100 %] 40 % Set Rate:  [14 bmp] 14 bmp Vt Set:  [650 mL] 650 mL PEEP:  [5 cmH20-8 cmH20] 5 cmH20 Pressure Support:  [8 cmH20] 8 cmH20 Plateau Pressure:  [23 cmH20-26 cmH20] 26 cmH20   Intake/Output Summary (Last 24 hours) at 05/10/2023 1148 Last data filed at 05/10/2023 1100 Gross per 24 hour  Intake 4365.83 ml  Output 1780 ml  Net 2585.83 ml   Filed Weights   05/08/23 0300 05/09/23 0500 05/10/23 0353  Weight: 127.7 kg 131 kg 130 kg    Examination: Fentanyl 100 General: adult male lying in bed in distress on PSV 8/5, tachycardic, hypertensive, RR 40's HEENT: MM pink/moist, ETT, cortrak, pupils 3/r, soft cervical collar in place, healing abrasion to right forehead Neuro: awake, follows simple commands, minimal movement in LUE, 4/5 RUE, and R> L movement in LE CV: rr, ST PULM:  labored on PSV, flipped back to full support with resolved WOB, diffuse rhonchi R> L GI: obese, soft, NT, +bs, foley Extremities: warm/dry, mild trace generalized edema  Skin: no rashes   CBG remain > 185- 231 Labs> CMET stable, ALT improving, WBC 11.5> 12.8, H/H stable Tmax 100.7  Day 4 unasyn  CXR>  Interval development of right paratracheal and apicolateral soft tissue opacity demonstrating mass effect upon the aerated right lung which together suggests either fluid within the extrapleural soft tissues and/or a apically  and medially loculated pleural effusion.  Interval development of additional right basilar pleural fluid.  Resolved Hospital Problem list    Assessment & Plan:  PEA cardiac arrest; unclear etiology. Required 2 rounds CPR. CTA negative for PE. Drug screen negative. Query if he fell,  c-spine injury, neurogenic shock and arrested.  Essential HTN Hyperlipidemia  - echo 12/17 unremarkable, EF 60-65%, no RWMA, normal RV - eeg without seizure activity, severe diffuse encephalopathy  P:  - cont supportive care - more hypertensive this am, obvious pain, anxiety.  Will restart pta clonidine and change to precedex/ dilaudid, increase klonopin and add back home oxy per tube with aggressive bowel regimen to minimize IV needs, facilitate weaning efforts - restart lipitor as LFTs improving - consider lasix, net +3.7L   Respiratory insufficiency secondary to above  Possible Aspiration Hx of OSA Prior tobacco use - worsening CXR this am with slight increase in WBC, temp, tan thick ETT secretions and poorly tolerated PSV.  Will get chest CT to further evaluate - send sputum cx, broaden unasyn to zosyn  - cont full LTVV support with daily PSV trials - VAP/ PPI  - add mucolytic's, albuterol   Unstable C-spine C1-C2 fracture with possible epidural hematoma s/p stabilization 12/19 Nasal bone fractures TBI 2013 with left-sided deficits Prior CVA, TBI with hemipparesis - no intervention for nasal fx per ENT - Appreciate NSGY input - c-collar per NSGY - multimodal pain regimen - will need guidance on when to restart VTE ppx, and chronic AC - cont neuroprotective measures- normothermia, euglycemia, HOB greater than 30, head in neutral alignment, normocapnia, normoxia  History of DVT and PE on chronic anticoagulation with Eliquis P: - holding AC till further guidance per NSGY - SCDs  History of hepatitis Elevated LFTs - LFTs improving, trend CMP periodically   DM2 with peripheral neuropathy; A1c 10 - remains hyperglycemic - cont resistant SSI prn - increase semglee and TF coverage for goal 1440-180 - semglee 15 U daily   Peripheral neuropathy - cont Cymbalta, gabapentin  Anxiety/depression - increase klonopin to help with IV sedation needs  Chronic radicular lumbar  pain with chronic pain syndrome - see above.  Gabapentin continued  Best Practice (right click and "Reselect all SmartList Selections" daily)   Diet/type: tubefeeds and NPO per cortrak DVT prophylaxis SCD Pressure ulcer(s): N/A GI prophylaxis: H2B Lines: N/A Foley:  Yes, and it is still needed Code Status:  full code Last date of multidisciplinary goals of care discussion: 12/18 updated fiance at bedside  Fiance updated at bedside 12/20  Labs   CBC: Recent Labs  Lab 05/06/23 1621 05/06/23 1632 05/06/23 1726 05/07/23 0259 05/08/23 0831 05/09/23 0845 05/10/23 0212  WBC 10.1  --   --  12.3* 10.7* 11.5* 12.8*  HGB 12.0*   < > 12.9* 13.0 12.0* 11.2* 11.1*  HCT 38.8*   < > 38.0* 40.5 37.1* 34.9* 34.4*  MCV 90.9  --   --  90.0 87.9 89.0 89.6  PLT 360  --   --  316 368 318 305   < > = values in this interval not displayed.    Basic Metabolic Panel: Recent Labs  Lab 05/06/23 1621 05/06/23 1632 05/06/23 1726 05/07/23 0703 05/08/23 0304 05/08/23 0831 05/08/23 1733 05/09/23 0845 05/09/23 1735 05/10/23 0212  NA 137 138 135 136  --  135  --  133*  --  137  K 3.4* 3.4* 3.5 3.8  --  3.5  --  4.1  --  4.0  CL 98 96*  --  97*  --  98  --  102  --  103  CO2 28  --   --  26  --  29  --  24  --  26  GLUCOSE 219* 232*  --  169*  --  258*  --  284*  --  226*  BUN 12 14  --  10  --  10  --  12  --  15  CREATININE 1.17 1.20  --  1.06  --  1.03  --  0.98  --  0.99  CALCIUM 8.9  --   --  9.1  --  8.8*  --  8.0*  --  8.3*  MG  --   --   --  2.0 2.0  --  2.0 1.9 1.9  --   PHOS  --   --   --  3.3 2.9  --  3.5 4.1 4.2  --    GFR: Estimated Creatinine Clearance: 120.8 mL/min (by C-G formula based on SCr of 0.99 mg/dL). Recent Labs  Lab 05/06/23 1633 05/07/23 0259 05/08/23 0831 05/09/23 0845 05/10/23 0212  WBC  --  12.3* 10.7* 11.5* 12.8*  LATICACIDVEN 3.0*  --   --   --   --     Liver Function Tests: Recent Labs  Lab 05/06/23 1621 05/07/23 0922 05/08/23 0831  05/09/23 0845 05/10/23 0212  AST 142* 87* 41 27 24  ALT 111* 143* 100* 63* 53*  ALKPHOS 150* 153* 133* 102 96  BILITOT 0.8 0.9 0.5 0.9 0.6  PROT 7.8 7.9 7.4 6.6 6.3*  ALBUMIN 3.3* 3.3* 3.0* 2.6* 2.4*   No results for input(s): "LIPASE", "AMYLASE" in the last 168 hours. No results for input(s): "AMMONIA" in the last 168 hours.  ABG    Component Value Date/Time   PHART 7.397 05/06/2023 1726   PCO2ART 55.4 (H) 05/06/2023 1726   PO2ART 201 (H) 05/06/2023 1726   HCO3 34.6 (H) 05/06/2023 1726   TCO2 36 (H) 05/06/2023 1726   O2SAT 100 05/06/2023 1726     Coagulation Profile: Recent Labs  Lab 05/06/23 1621  INR 1.2    Cardiac Enzymes: No results for input(s): "CKTOTAL", "CKMB", "CKMBINDEX", "TROPONINI" in the last 168 hours.  HbA1C: Hgb A1c MFr Bld  Date/Time Value Ref Range Status  05/06/2023 08:25 PM 10.0 (H) 4.8 - 5.6 % Final    Comment:    (NOTE) Pre diabetes:          5.7%-6.4%  Diabetes:              >6.4%  Glycemic control for   <7.0% adults with diabetes     CBG: Recent Labs  Lab 05/09/23 1613 05/09/23 1926 05/09/23 2347 05/10/23 0346 05/10/23 0813  GLUCAP 197* 185* 217* 201* 231*   Allergies Allergies  Allergen Reactions   Tape Other (See Comments)    Blisters     Home Medications  Prior to Admission medications   Medication Sig Start Date End Date Taking? Authorizing Provider  acetaminophen-codeine (TYLENOL #3) 300-30 MG tablet Take 1 tablet by mouth every 4 (four) hours as needed. 04/22/23   [provider]  atorvastatin (LIPITOR) 40 MG tablet Take 40 mg by mouth daily. 04/13/23   [provider]  chlorhexidine (PERIDEX) 0.12 % solution Use as directed 15 mLs in the mouth or throat in the morning, at noon, and at bedtime. 04/22/23   [provider]  clonazePAM (KLONOPIN) 0.5 MG  tablet Take 0.5 mg by mouth 2 (two) times daily as needed. 04/12/23   [provider]  cloNIDine (CATAPRES) 0.1 MG tablet Take 0.1  mg by mouth 2 (two) times daily as needed. 04/12/23   [provider]  DULoxetine (CYMBALTA) 30 MG capsule Take 30 mg by mouth every morning. 05/01/23   [provider]  ELIQUIS 5 MG TABS tablet Take 5 mg by mouth 2 (two) times daily. 03/18/23   [provider]  fluticasone (FLONASE) 50 MCG/ACT nasal spray Place 2 sprays into both nostrils at bedtime. 04/28/23   [provider]  furosemide (LASIX) 20 MG tablet Take 20 mg by mouth daily as needed. 04/01/23   [provider]  furosemide (LASIX) 40 MG tablet Take 40 mg by mouth daily as needed. 04/08/23   [provider]  gabapentin (NEURONTIN) 800 MG tablet Take 800 mg by mouth 3 (three) times daily. 04/09/23   [provider]  HUMALOG KWIKPEN 100 UNIT/ML KwikPen Inject 2-10 Units into the skin as directed. 04/08/23   [provider]  hydrochlorothiazide (HYDRODIURIL) 12.5 MG tablet Take 12.5 mg by mouth daily. 04/23/23   [provider]  losartan (COZAAR) 100 MG tablet Take 100 mg by mouth daily. 04/20/23   [provider]  mirtazapine (REMERON) 45 MG tablet Take 45 mg by mouth at bedtime. 05/01/23   [provider]  oxyCODONE-acetaminophen (PERCOCET/ROXICET) 5-325 MG tablet Take 1 tablet by mouth every 6 (six) hours as needed. 04/19/23   [provider]  sildenafil (REVATIO) 20 MG tablet Take 20 mg by mouth as needed. 04/19/23   [provider]  TRULICITY 1.5 MG/0.5ML SOAJ Inject 1.5 mg into the skin once a week. 03/29/23   [provider]  Vitamin D, Ergocalciferol, (DRISDOL) 1.25 MG (50000 UNIT) CAPS capsule Take 50,000 Units by mouth once a week. 04/20/23   [provider]     Critical care time: 35 minutes      Posey Boyer, MSN, AG-ACNP-BC [facility] 05/10/2023, 11:49 AM  See Amion for pager If no response to pager , please call [phone] until 7pm After 7:00 pm call Elink   [phone]

## 2023-05-10 NOTE — Progress Notes (Signed)
RT, RN and transport x2 transported Pt via ventilator with no apparent complications. Pt back in 2H.

## 2023-05-10 NOTE — Progress Notes (Signed)
OT Cancellation Note  Patient Details Name: Glenn Henderson MRN: [id number] DOB: 19-Sep-1965   Cancelled Treatment:    Reason Eval/Treat Not Completed: Patient not medically ready;Patient's level of consciousness remains intubated and sedated. Holding therapy attempts this AM. Will follow up at a later time.   Lorre Munroe 05/10/2023, 9:54 AM

## 2023-05-10 NOTE — Progress Notes (Signed)
PT Cancellation Note  Patient Details Name: Glenn Henderson MRN: [id number] DOB: December 11, 1965   Cancelled Treatment:    Reason Eval/Treat Not Completed: Patient not medically ready (Patient not medically ready;Patient's level of consciousness remains intubated and sedated. Holding therapy attempts this AM. Will follow up at a later time.)  Harrel Carina, DPT, CLT  Acute Rehabilitation Services Office: [phone] (Secure chat preferred)   Claudia Desanctis 05/10/2023, 10:34 AM

## 2023-05-10 NOTE — Progress Notes (Addendum)
CCM NP contacted regarding giving 4mg  dilaudid in a short interval of time and increase of propofol to resedate patient.  Patient still awake and needing more meds after 4 mg of dilaudid and of propofol.  RN requested dilaudid drip per oder. RN increasing propofol per order.   IV accesses checked recently with IV dressing changes and both return blood.  RN will give versed before heading to the CT scanner.

## 2023-05-11 DIAGNOSIS — S022XXA Fracture of nasal bones, initial encounter for closed fracture: Secondary | ICD-10-CM | POA: Diagnosis not present

## 2023-05-11 DIAGNOSIS — I469 Cardiac arrest, cause unspecified: Secondary | ICD-10-CM | POA: Diagnosis not present

## 2023-05-11 NOTE — Progress Notes (Signed)
RN noticed patients fiance, Arlie, vaping in the patient room. Charge nurse was notified and confronted visitor about vaping. RN also witnessed fiance place something under the patient's tongue. Fiance denied giving the patient anything and denied vaping. Charge RN escorted fiance to the waiting room and a full set of vitals were obtained. After explaining the dangers of taking anything other than what is prescribed, the patient admitted to taking "roxy" under the tongue. He was unaware of the dose he was given by his fiance. He also said that she had let him use her vape. MD was notified. Security and [employer] were notified of banned visitation. Vital signs are stable and patient has been educated.

## 2023-05-11 NOTE — Procedures (Signed)
Extubation Procedure Note  Patient Details:   Name: Glenn Henderson DOB: 1965-08-14 MRN: [id number]   Airway Documentation:    Vent end date: 05/11/23 Vent end time: 1012   Evaluation  O2 sats: stable throughout Complications: No apparent complications Patient did tolerate procedure well. Bilateral Breath Sounds: Rhonchi   Yes, pt able to cough to clear secretions. Pt positive for cuff leak prior to extubation. Pt tolerated procedure well and was placed on 3L humidified nasal cannula  Tacy Learn 05/11/2023, 10:24 AM

## 2023-05-11 NOTE — Progress Notes (Signed)
Glenn Henderson, MRN:  [id number], DOB:  11/10/1965, LOS: 5 ADMISSION DATE:  05/06/2023, CONSULTATION DATE:  05/06/2023 REFERRING MD:  Dr. Karene Fry - EDP, CHIEF COMPLAINT:  Cardiac arrest    History of Present Illness:  57 year old male with past medical history significant for hyperlipidemia, OSA, history of hepatitis, MDD, anxiety, PTSD, prior tobacco abuse, DM2 with peripheral neuropathy and latest A1c 12.4, chronic radicular lumbar pain with chronic pain syndrome, history of DVT and PE on chronic anticoagulation with Eliquis, TBI 2013 with left-sided deficits, history of seizures, abnormal thyroid disorder, who presented to the ED 12/16 via EMS after suffering out-of-hospital cardiac arrest.  Patient required 2 rounds of CPR prior to ROSC. Per EMS patient did hit head when he arrested, arrived in c-collar. On arrival patient was seen hypertensive, mildly hypothermic and tachycardic. Lab work significant for K 3.4, glucose 219, alkaline phosphastat 150, albumin 3.3, AST 142, ALT 111, lactic 3.0, Hgb 12.0. CT cervical spine on admit positive for acute unstable fracture with epidural hemorrhage. PCCM consulted for further assistance and admisison  Pertinent  Medical History  HTN, OSA, diabetes, prior CVA, TBI with hemipparesis, CKD, DVT and PE   Significant Hospital Events: Including procedures, antibiotic start and stop dates in addition to other pertinent events   12/16 admitted post cardiac arrest 12/17 MRI spine known fracture at base of dens with cord edema, unstable fracture.  12/18: neurosurgery rec surgical stabilization of occiput to C2. Recommending to keep intubated/sedated prior to surgical fixation.  12/19: NAEON. Pending NSGY intervention. Deep sedation.   Interim History / Subjective:  Patient remained afebrile Tolerating supportive breathing trial Propofol was titrated off, he is on low-dose Precedex   Objective   Blood pressure (!) 170/91, pulse 75, temperature 99.2 F  (37.3 C), temperature source Oral, resp. rate 15, height 6\' 2"  (1.88 m), weight 134.2 kg, SpO2 99%.    Vent Mode: PRVC FiO2 (%):  [40 %] 40 % Set Rate:  [14 bmp] 14 bmp Vt Set:  [650 mL] 650 mL PEEP:  [5 cmH20-8 cmH20] 8 cmH20 Pressure Support:  [8 cmH20] 8 cmH20 Plateau Pressure:  [25 cmH20-26 cmH20] 25 cmH20   Intake/Output Summary (Last 24 hours) at 05/11/2023 0916 Last data filed at 05/11/2023 0800 Gross per 24 hour  Intake 2807.09 ml  Output 3275 ml  Net -467.91 ml   Filed Weights   05/09/23 0500 05/10/23 0353 05/11/23 0500  Weight: 131 kg 130 kg 134.2 kg    Examination: General: Acute on chronically ill-appearing male, orally intubated HEENT: [place]/AT, eyes anicteric.  ETT and OGT in place.  Neck collar in place Neuro:, Following commands, moving right side spontaneously, weaker on left upper extremity Chest: Coarse breath sounds, no wheezes or rhonchi Heart: Regular rate and rhythm, no murmurs or gallops Abdomen: Soft, nondistended, bowel sounds present Skin: No rash   Labs and images reviewed  Resolved Hospital Problem list    Assessment & Plan:  Status post PEA cardiac arrest of unclear etiology could be related to neurogenic shock after he fell and had spine fracture Echo 12/17 unremarkable, EF 60-65%, no RWMA, normal RV Remained in sinus rhythm EEG ruled out active seizures Supportive care  At acute respiratory failure with hypoxia Aspiration pneumonia Hx of OSA Prior tobacco use CT chest confirmed bilateral lower lobe infiltrates likely due to bilateral pneumonia Became afebrile after switching antibiotic from Unasyn to Zosyn Continue protective ventilation VAP prevention bundle in place PAD protocol with Dilaudid and Precedex Tolerating spontaneous breathing  trial, will try to extubate  Unstable C-spine C1-C2 fracture with possible epidural hematoma s/p stabilization 12/19 Nasal bone fractures TBI 2013 with left-sided deficits Prior CVA, TBI with  hemipparesis ENT recommend outpatient follow-up Appreciate neurosurgery input PT/OT evaluation postextubation  History of DVT and PE on chronic anticoagulation with Eliquis, status post IVC filter Currently off anticoagulation Neurosurgery to clear when to restart antibiotics  Poorly controlled type 2 diabetes with hyperglycemia, complicated with peripheral neuropathy.  A1c 10 Still remain hyperglycemic Continue sliding scale insulin Continue tube feed coverage Increase Semglee to 20 units twice daily Monitor fingerstick goal 140-180  Peripheral neuropathy Continue Cymbalta, gabapentin  Anxiety/depression Continue Klonopin   Best Practice (right click and "Reselect all SmartList Selections" daily)   Diet/type: Continue tube feeds, speech and swallow evaluation postextubation DVT prophylaxis SCD Pressure ulcer(s): N/A GI prophylaxis: H2B Lines: N/A Foley:  Yes, and it is still needed Code Status:  full code Last date of multidisciplinary goals of care discussion: 12/20: Patient family was updated at bedside, decision was to continue full scope of care    Labs   CBC: Recent Labs  Lab 05/07/23 0259 05/08/23 0831 05/09/23 0845 05/10/23 0212 05/11/23 0241  WBC 12.3* 10.7* 11.5* 12.8* 12.4*  HGB 13.0 12.0* 11.2* 11.1* 11.9*  HCT 40.5 37.1* 34.9* 34.4* 36.7*  MCV 90.0 87.9 89.0 89.6 87.6  PLT 316 368 318 305 321    Basic Metabolic Panel: Recent Labs  Lab 05/07/23 0703 05/08/23 0304 05/08/23 0831 05/08/23 1733 05/09/23 0845 05/09/23 1735 05/10/23 0212 05/11/23 0241  NA 136  --  135  --  133*  --  137 136  K 3.8  --  3.5  --  4.1  --  4.0 3.5  CL 97*  --  98  --  102  --  103 103  CO2 26  --  29  --  24  --  26 25  GLUCOSE 169*  --  258*  --  284*  --  226* 257*  BUN 10  --  10  --  12  --  15 15  CREATININE 1.06  --  1.03  --  0.98  --  0.99 0.99  CALCIUM 9.1  --  8.8*  --  8.0*  --  8.3* 8.8*  MG 2.0 2.0  --  2.0 1.9 1.9  --  2.1  PHOS 3.3 2.9  --  3.5  4.1 4.2  --   --    GFR: Estimated Creatinine Clearance: 122.8 mL/min (by C-G formula based on SCr of 0.99 mg/dL). Recent Labs  Lab 05/06/23 1633 05/07/23 0259 05/08/23 0831 05/09/23 0845 05/10/23 0212 05/11/23 0241  WBC  --    < > 10.7* 11.5* 12.8* 12.4*  LATICACIDVEN 3.0*  --   --   --   --   --    < > = values in this interval not displayed.    Liver Function Tests: Recent Labs  Lab 05/07/23 0922 05/08/23 0831 05/09/23 0845 05/10/23 0212 05/11/23 0241  AST 87* 41 27 24 47*  ALT 143* 100* 63* 53* 79*  ALKPHOS 153* 133* 102 96 137*  BILITOT 0.9 0.5 0.9 0.6 0.7  PROT 7.9 7.4 6.6 6.3* 7.3  ALBUMIN 3.3* 3.0* 2.6* 2.4* 2.5*   No results for input(s): "LIPASE", "AMYLASE" in the last 168 hours. No results for input(s): "AMMONIA" in the last 168 hours.  ABG    Component Value Date/Time   PHART 7.397 05/06/2023 1726  PCO2ART 55.4 (H) 05/06/2023 1726   PO2ART 201 (H) 05/06/2023 1726   HCO3 34.6 (H) 05/06/2023 1726   TCO2 36 (H) 05/06/2023 1726   O2SAT 100 05/06/2023 1726     Coagulation Profile: Recent Labs  Lab 05/06/23 1621  INR 1.2    Cardiac Enzymes: No results for input(s): "CKTOTAL", "CKMB", "CKMBINDEX", "TROPONINI" in the last 168 hours.  HbA1C: Hgb A1c MFr Bld  Date/Time Value Ref Range Status  05/06/2023 08:25 PM 10.0 (H) 4.8 - 5.6 % Final    Comment:    (NOTE) Pre diabetes:          5.7%-6.4%  Diabetes:              >6.4%  Glycemic control for   <7.0% adults with diabetes     CBG: Recent Labs  Lab 05/10/23 1626 05/10/23 2031 05/11/23 0051 05/11/23 0311 05/11/23 0740  GLUCAP 265* 283* 286* 244* 190*   Allergies Allergies  Allergen Reactions   Tape Other (See Comments)    Blisters     Home Medications  Prior to Admission medications   Medication Sig Start Date End Date Taking? Authorizing Provider  acetaminophen-codeine (TYLENOL #3) 300-30 MG tablet Take 1 tablet by mouth every 4 (four) hours as needed. 04/22/23   [provider]  atorvastatin (LIPITOR) 40 MG tablet Take 40 mg by mouth daily. 04/13/23   [provider]  chlorhexidine (PERIDEX) 0.12 % solution Use as directed 15 mLs in the mouth or throat in the morning, at noon, and at bedtime. 04/22/23   [provider]  clonazePAM (KLONOPIN) 0.5 MG tablet Take 0.5 mg by mouth 2 (two) times daily as needed. 04/12/23   [provider]  cloNIDine (CATAPRES) 0.1 MG tablet Take 0.1 mg by mouth 2 (two) times daily as needed. 04/12/23   [provider]  DULoxetine (CYMBALTA) 30 MG capsule Take 30 mg by mouth every morning. 05/01/23   [provider]  ELIQUIS 5 MG TABS tablet Take 5 mg by mouth 2 (two) times daily. 03/18/23   [provider]  fluticasone (FLONASE) 50 MCG/ACT nasal spray Place 2 sprays into both nostrils at bedtime. 04/28/23   [provider]  furosemide (LASIX) 20 MG tablet Take 20 mg by mouth daily as needed. 04/01/23   [provider]  furosemide (LASIX) 40 MG tablet Take 40 mg by mouth daily as needed. 04/08/23   [provider]  gabapentin (NEURONTIN) 800 MG tablet Take 800 mg by mouth 3 (three) times daily. 04/09/23   [provider]  HUMALOG KWIKPEN 100 UNIT/ML KwikPen Inject 2-10 Units into the skin as directed. 04/08/23   [provider]  hydrochlorothiazide (HYDRODIURIL) 12.5 MG tablet Take 12.5 mg by mouth daily. 04/23/23   [provider]  losartan (COZAAR) 100 MG tablet Take 100 mg by mouth daily. 04/20/23   [provider]  mirtazapine (REMERON) 45 MG tablet Take 45 mg by mouth at bedtime. 05/01/23   [provider]  oxyCODONE-acetaminophen (PERCOCET/ROXICET) 5-325 MG tablet Take 1 tablet by mouth every 6 (six) hours as needed. 04/19/23   [provider]  sildenafil (REVATIO) 20 MG tablet Take 20 mg by mouth as needed. 04/19/23   [provider]  TRULICITY 1.5 MG/0.5ML SOAJ Inject 1.5 mg into the  skin once a week. 03/29/23   [provider]  Vitamin D, Ergocalciferol, (DRISDOL) 1.25 MG (50000 UNIT) CAPS capsule Take 50,000 Units by mouth once a week. 04/20/23  [provider]     The patient is critically ill due to status post PEA cardiac arrest/unstable C1/C2 vertebral spine fracture critical care was necessary to treat or prevent imminent or life-threatening deterioration.  Critical care was time spent personally by me on the following activities: development of treatment plan with patient and/or surrogate as well as nursing, discussions with consultants, evaluation of patient's response to treatment, examination of patient, obtaining history from patient or surrogate, ordering and performing treatments and interventions, ordering and review of laboratory studies, ordering and review of radiographic studies, pulse oximetry, re-evaluation of patient's condition and participation in multidisciplinary rounds.   During this encounter critical care time was devoted to patient care services described in this note for 35 minutes.     Cheri Fowler, MD [facility] See Amion for pager If no response to pager, please call [phone] until 7pm After 7pm, Please call E-link [phone]

## 2023-05-11 NOTE — Evaluation (Addendum)
Physical Therapy Evaluation Patient Details Name: Glenn Henderson MRN: [id number] DOB: November 11, 1965 Today's Date: 05/11/2023  History of Present Illness  Pt is a 57 y.o. 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 CT with type II odontoid fx now s/p screw fixation with fluoroscopic guidance and replacement of odontoid screw. EEG with severe diffuse encephalopathy. PMH significant for DMII, OSA, TBI with L weakness, history of DVT and PE on anticoagulation, history of hepatitis, peripheral neuropathy, seizures.   Clinical Impression  Pt presents with condition above and deficits mentioned below, see PT Problem List. Pt reports he was independent with functional mobility, intermittently not needing any AD and then other times needing a cane or RW depending on how he was feeling daily. Pt reports he lives with his significant other in a 1-level house with a ramped entrance. It is unclear whether this is all accurate or not as pt is confused this date and no family is currently present. Per chart, pt has some residual L-sided weakness from a prior TBI, but RN was told by family that the pt is L-handed at baseline. Based on his report that he is ambulatory without an AD at baseline, the residual weakness was likely very mild. Currently, pt displays significant weakness on his L side compared to his R. He was able to extend his L knee against gravity when cued, but not fully. His L UE was flaccid this date. He also displays a L lateral and posterior lean in sitting. He displays deficits also in cognition, balance, power, and activity tolerance. He did not display much if any initiation to assist with bed mobility and thus required total assist x2 for all bed mobility aspects this date. He required up to [facility] for static sitting balance at EOB. Deferred OOB mobility this date for pt safety. Based on pt's likely drastic  functional decline, he could greatly benefit from intensive inpatient rehab, > 3 hours/day, provided his ability to participate improves, which I suspect it will. Will continue to follow acutely.         If plan is discharge home, recommend the following: Two people to help with walking and/or transfers;Two people to help with bathing/dressing/bathroom;Assistance with cooking/housework;Direct supervision/assist for medications management;Direct supervision/assist for financial management;Assist for transportation;Help with stairs or ramp for entrance   Can travel by private vehicle        Equipment Recommendations Other (comment) (TBD)  Recommendations for Other Services  Rehab consult    Functional Status Assessment Patient has had a recent decline in their functional status and demonstrates the ability to make significant improvements in function in a reasonable and predictable amount of time.     Precautions / Restrictions Precautions Precautions: Cervical;Fall;Other (comment) Precaution Booklet Issued: No Precaution Comments: watch vitals Required Braces or Orthoses: Cervical Brace Cervical Brace: Soft collar;At all times (per RN - verbal report from MD to wear collar all times and not remove at rest) Restrictions Weight Bearing Restrictions Per Provider Order: No      Mobility  Bed Mobility Overal bed mobility: Needs Assistance Bed Mobility: Rolling, Sidelying to Sit, Sit to Sidelying Rolling: Total assist, +2 for safety/equipment, +2 for physical assistance Sidelying to sit: Total assist, +2 for physical assistance, +2 for safety/equipment, HOB elevated     Sit to sidelying: Total assist, +2 for physical assistance, +2 for safety/equipment General bed mobility comments: poor initiaton with roll with step by  step cues and pt with limited initiation. Total Ax2 for all portions with limited to no initiation to sitting upright EOB or swinging legs back onto bed with return to  supine    Transfers                   General transfer comment: deferred for pt safety this date    Ambulation/Gait               General Gait Details: deferred  Stairs            Wheelchair Mobility     Tilt Bed    Modified Rankin (Stroke Patients Only)       Balance Overall balance assessment: Needs assistance Sitting-balance support: No upper extremity supported, Single extremity supported Sitting balance-Leahy Scale: Poor Sitting balance - Comments: Min-mod A for static sitting. Multimodal cues to achieve midline but unable to maintain. Postural control: Left lateral lean, Posterior lean     Standing balance comment: deferred                             Pertinent Vitals/Pain Pain Assessment Pain Assessment: Faces Faces Pain Scale: Hurts even more Pain Location: neck Pain Descriptors / Indicators: Aching, Constant, Sore Pain Intervention(s): Limited activity within patient's tolerance, Monitored during session, Premedicated before session, Repositioned, Patient requesting pain meds-RN notified    Home Living Family/patient expects to be discharged to:: Private residence Living Arrangements: Spouse/significant other Available Help at Discharge: Family Type of Home: House Home Access: Ramped entrance       Home Layout: One level Home Equipment: Shower seat;Cane - single Librarian, academic (2 wheels);BSC/3in1;Wheelchair - power Additional Comments: unsure of accuracy of info as pt often agreed or repeated with the final option privided by therapists    Prior Function Prior Level of Function : Independent/Modified Independent;Patient poor historian/Family not available             Mobility Comments: No AD vs use of cane vs RW depending on how he is feeling ADLs Comments: Pt reports he took care of himself; would suspect assist with IADL, but pt did not report     Extremity/Trunk Assessment   Upper Extremity  Assessment Upper Extremity Assessment: Defer to OT evaluation RUE Deficits / Details: decreased initiation needing increased time LUE Deficits / Details: flaccid, sublux noted. LUE: Subluxation noted LUE Sensation: decreased light touch LUE Coordination: decreased fine motor;decreased gross motor    Lower Extremity Assessment Lower Extremity Assessment: Generalized weakness;LLE deficits/detail LLE Deficits / Details: per chart, pt with L residual weakness from prior TBI, but seemingly was still pretty strong as he reports he was ambulatory intermittently without AD; currently demonstrates MMT score of 3- knee extension, L weaker than R side (R MMT score of 4+ knee extension); spasticity and clonus noted with PROM into combined hip flexion, knee flexion, and ankle dorsiflexion when supine LLE Coordination: decreased gross motor;decreased fine motor    Cervical / Trunk Assessment Cervical / Trunk Assessment: Neck Surgery  Communication   Communication Communication: Other (comment);Difficulty communicating thoughts/reduced clarity of speech (slurred speech; does not seem to have much difficulty word finding. Difficulty following commands, unsure if due to communication or cognition) Cueing Techniques: Verbal cues;Gestural cues;Tactile cues  Cognition Arousal: Alert, Lethargic Behavior During Therapy: Flat affect Overall Cognitive Status: Impaired/Different from baseline Area of Impairment: Attention, Following commands, Awareness, Safety/judgement, Problem solving, Memory  Current Attention Level: Focused Memory: Decreased short-term memory Following Commands: Follows one step commands inconsistently, Follows one step commands with increased time Safety/Judgement: Decreased awareness of safety Awareness: Intellectual, Emergent Problem Solving: Slow processing, Decreased initiation, Difficulty sequencing, Requires verbal cues, Requires tactile cues General  Comments: Pt with slow, slurred speech, needing cues for all initiation. and achievement of midline at EOB. Poor awareness and arousal throughout needing repeated commands and multimodal cues to carry out action. Constantly asking for pain medication again reporting he was on fentanyl PTA from a Dr. Weyman Pedro in [place] for chronic pain "all over". RN aware.        General Comments General comments (skin integrity, edema, etc.): VSS except RR up to 43    Exercises     Assessment/Plan    PT Assessment Patient needs continued PT services  PT Problem List Decreased strength;Decreased activity tolerance;Decreased balance;Impaired tone;Decreased cognition;Decreased coordination;Decreased mobility;Cardiopulmonary status limiting activity       PT Treatment Interventions DME instruction;Gait training;Stair training;Functional mobility training;Therapeutic activities;Therapeutic exercise;Neuromuscular re-education;Balance training;Cognitive remediation;Patient/family education;Wheelchair mobility training    PT Goals (Current goals can be found in the Care Plan section)  Acute Rehab PT Goals Patient Stated Goal: to walk again PT Goal Formulation: With patient Time For Goal Achievement: 05/25/23 Potential to Achieve Goals: Fair    Frequency Min 1X/week     Co-evaluation PT/OT/SLP Co-Evaluation/Treatment: Yes Reason for Co-Treatment: Complexity of the patient's impairments (multi-system involvement);Necessary to address cognition/behavior during functional activity;For patient/therapist safety;To address functional/ADL transfers PT goals addressed during session: Mobility/safety with mobility;Balance OT goals addressed during session: Strengthening/ROM       AM-PAC PT "6 Clicks" Mobility  Outcome Measure Help needed turning from your back to your side while in a flat bed without using bedrails?: Total Help needed moving from lying on your back to sitting on the side of a flat bed without  using bedrails?: Total Help needed moving to and from a bed to a chair (including a wheelchair)?: Total Help needed standing up from a chair using your arms (e.g., wheelchair or bedside chair)?: Total Help needed to walk in hospital room?: Total Help needed climbing 3-5 steps with a railing? : Total 6 Click Score: 6    End of Session Equipment Utilized During Treatment: Oxygen Activity Tolerance: Patient limited by fatigue;Patient limited by pain Patient left: in bed;with call bell/phone within reach;with bed alarm set Nurse Communication: Mobility status;Other (comment);Patient requests pain meds (pt reporting taking fentanyl PTA) PT Visit Diagnosis: Muscle weakness (generalized) (M62.81);Difficulty in walking, not elsewhere classified (R26.2);Other symptoms and signs involving the nervous system (R29.898)    Time: 5284-1324 PT Time Calculation (min) (ACUTE ONLY): 37 min   Charges:   PT Evaluation $PT Eval Moderate Complexity: 1 Mod   PT General Charges $$ ACUTE PT VISIT: 1 Visit         Virgil Benedict, PT, DPT Acute Rehabilitation Services  Office: [phone]   Bettina Gavia 05/11/2023, 6:08 PM

## 2023-05-11 NOTE — Progress Notes (Signed)
[facility] ADULT ICU REPLACEMENT PROTOCOL   The patient does apply for the [facility] Adult ICU Electrolyte Replacment Protocol based on the criteria listed below:   1.Exclusion criteria: TCTS, ECMO, Dialysis, and Myasthenia Gravis patients 2. Is GFR >/= 30 ml/min? Yes.    Patient's GFR today is >60 3. Is SCr </= 2? Yes.   Patient's SCr is 0.99 mg/dL 4. Did SCr increase >/= 0.5 in 24 hours? No. 5.Pt's weight >40kg  Yes.   6. Abnormal electrolyte(s): K+ = 3.5  7. Electrolytes replaced per protocol 8.  Call MD STAT for K+ </= 2.5, Phos </= 1, or Mag </= 1 Physician:  Everardo All, eMD  Suzan Slick Kahleb Mcclane 05/11/2023 5:03 AM

## 2023-05-11 NOTE — Progress Notes (Signed)
Patient ID: Glenn Henderson, male   DOB: 1965/12/19, 57 y.o.   MRN: [id number] Paitent more alert today, preparing to extubate. Motor function remains unchanged. Pain management reasonable.

## 2023-05-11 NOTE — Progress Notes (Signed)
OT Cancellation Note  Patient Details Name: Glenn Henderson MRN: [id number] DOB: 05/24/1965   Cancelled Treatment:    Reason Eval/Treat Not Completed: Other (comment) (Per RN, MD recently in room and hoping to both wean and extubate in the next hour or 2. Will follow up as scheule allows.)  Tyler Deis, OTR/L [facility] Acute Rehabilitation Office: [phone]   Myrla Halsted 05/11/2023, 9:27 AM

## 2023-05-11 NOTE — Evaluation (Signed)
Occupational Therapy Evaluation Patient Details Name: Glenn Henderson MRN: [id number] DOB: 11/27/1965 Today's Date: 05/11/2023   History of Present Illness Pt is a 57 y.o. male presenting 12/16 after collapse outside of barber shop; C collar in place and received 2 rounds of CPR with ROSC. CT with type II odontoid fx now s/p screw fixation with fluoroscopic guidance and replacement of odontoid screw. EEG with severe diffuse encephalopathy. PMH significant for DMII, OSA, TBI with L weakness, history of DVT and PE on anticoagulation, history of hepatitis, peripheral neuropathy, seizures.   Clinical Impression   PTA, pt reports he was mod I for mobility and BADL; unsure of accuracy of report. Upon eval, pt needing total a for bed mobility, max A for UB ADL, and total A for LB ADL. Transfers deferred this session for safety. Pt with poor cognition and arousal; may need follow up MRI if does not improve; poor command following, initiation, sequencing, safety. LUE with no active movement this session, but pt does have some sensation. Due to significant change in functional status, recommending intensive multidisciplinary rehabilitation >3 hours/day to optimize safety and independence in ADL.        If plan is discharge home, recommend the following: Two people to help with walking and/or transfers;Two people to help with bathing/dressing/bathroom;Assist for transportation;Help with stairs or ramp for entrance;Direct supervision/assist for financial management;Direct supervision/assist for medications management;Assistance with cooking/housework;Assistance with feeding;Supervision due to cognitive status    Functional Status Assessment  Patient has had a recent decline in their functional status and demonstrates the ability to make significant improvements in function in a reasonable and predictable amount of time.  Equipment Recommendations  Other (comment) (defer)    Recommendations for Other Services  Rehab consult     Precautions / Restrictions Precautions Precautions: Other (comment);Cervical Precaution Comments: RN reports MD says no orders, just wear brace, do not remove for rest Restrictions Weight Bearing Restrictions Per Provider Order: No      Mobility Bed Mobility Overal bed mobility: Needs Assistance Bed Mobility: Rolling, Sidelying to Sit Rolling: Total assist, +2 for safety/equipment, +2 for physical assistance Sidelying to sit: Total assist, +2 for physical assistance, +2 for safety/equipment       General bed mobility comments: poor initiaton with roll with step by step cues and pt with limited initiation. TOtal A for all portions with limited to no initiation to upright    Transfers                   General transfer comment: deferred      Balance Overall balance assessment: Needs assistance Sitting-balance support: No upper extremity supported, Single extremity supported Sitting balance-Leahy Scale: Poor Sitting balance - Comments: Min-mod A for static sitting. Multimodal cues to achieve midline but unable to maintain.                                   ADL either performed or assessed with clinical judgement   ADL Overall ADL's : Needs assistance/impaired Eating/Feeding: NPO   Grooming: Maximal assistance;Sitting   Upper Body Bathing: Maximal assistance;Sitting   Lower Body Bathing: Total assistance;+2 for physical assistance;+2 for safety/equipment;Sitting/lateral leans   Upper Body Dressing : Maximal assistance;Sitting   Lower Body Dressing: Total assistance;+2 for physical assistance;+2 for safety/equipment;Sitting/lateral leans                       Vision  Additional Comments: Pt unable to report. does visually scan to locate therapist but not consistently     Perception         Praxis         Pertinent Vitals/Pain Pain Assessment Pain Assessment: Faces Faces Pain Scale: Hurts even more Pain  Location: neck Pain Descriptors / Indicators: Aching, Constant, Sore Pain Intervention(s): Limited activity within patient's tolerance, Monitored during session, Premedicated before session, RN gave pain meds during session     Extremity/Trunk Assessment Upper Extremity Assessment Upper Extremity Assessment: Generalized weakness;Left hand dominant;LUE deficits/detail;RUE deficits/detail RUE Deficits / Details: decreased initiation needing increased time LUE Deficits / Details: flaccid, sublux noted. LUE: Subluxation noted LUE Sensation: decreased light touch LUE Coordination: decreased fine motor;decreased gross motor   Lower Extremity Assessment Lower Extremity Assessment: Defer to PT evaluation   Cervical / Trunk Assessment Cervical / Trunk Assessment: Neck Surgery   Communication Communication Communication: Other (comment);Difficulty communicating thoughts/reduced clarity of speech (slurred speech; does not seem to have much difficulty word finding. Difficulty following commands, unsure if due to communication or cognition) Cueing Techniques: Verbal cues;Gestural cues;Tactile cues   Cognition Arousal: Alert, Lethargic Behavior During Therapy: Flat affect Overall Cognitive Status: Impaired/Different from baseline Area of Impairment: Attention, Following commands, Awareness, Safety/judgement, Problem solving, Memory                   Current Attention Level: Focused Memory: Decreased short-term memory Following Commands: Follows one step commands inconsistently, Follows one step commands with increased time Safety/Judgement: Decreased awareness of safety Awareness: Intellectual, Emergent Problem Solving: Slow processing, Decreased initiation, Difficulty sequencing, Requires verbal cues, Requires tactile cues General Comments: Pt with slow, slurred speech, needing cues for all initiation. and achievement of midline at EOB. Poor awareness and arousal throughout needing  repeated commands and multimopdal cues to carry out action. Constantly asking for pain medication again reporting he was on fentanyl PTA from a Dr. Weyman Pedro in [place] for chronic pain "all over".     General Comments       Exercises     Shoulder Instructions      Home Living Family/patient expects to be discharged to:: Private residence Living Arrangements: Spouse/significant other Available Help at Discharge: Family Type of Home: House Home Access: Ramped entrance     Home Layout: One level     Bathroom Shower/Tub: Tub/shower unit         Home Equipment: Shower seat;Cane - single Librarian, academic (2 wheels);BSC/3in1;Wheelchair - power   Additional Comments: unsure of accuracy of info as pt often agreed or repeated with the final option privided by therapists      Prior Functioning/Environment               Mobility Comments: No AD vs use of cane vs RW depending on how he is feeling ADLs Comments: Pt reports he took care of himself; would suspect assist with IADL, but pt did not report        OT Problem List: Decreased strength;Decreased range of motion;Decreased activity tolerance;Impaired balance (sitting and/or standing);Decreased cognition;Decreased coordination;Decreased safety awareness;Decreased knowledge of use of DME or AE;Decreased knowledge of precautions;Impaired sensation;Impaired tone;Impaired UE functional use      OT Treatment/Interventions: Self-care/ADL training;Therapeutic exercise;DME and/or AE instruction;Balance training;Patient/family education;Therapeutic activities;Cognitive remediation/compensation    OT Goals(Current goals can be found in the care plan section) Acute Rehab OT Goals Patient Stated Goal: decrease pain OT Goal Formulation: With patient Time For Goal Achievement: 05/25/23 Potential to Achieve Goals: Good  OT Frequency: Min 1X/week    Co-evaluation PT/OT/SLP Co-Evaluation/Treatment: Yes Reason for Co-Treatment:  Complexity of the patient's impairments (multi-system involvement);Necessary to address cognition/behavior during functional activity;For patient/therapist safety;To address functional/ADL transfers PT goals addressed during session: Mobility/safety with mobility OT goals addressed during session: Strengthening/ROM      AM-PAC OT "6 Clicks" Daily Activity     Outcome Measure Help from another person eating meals?: Total Help from another person taking care of personal grooming?: A Lot Help from another person toileting, which includes using toliet, bedpan, or urinal?: Total Help from another person bathing (including washing, rinsing, drying)?: Total Help from another person to put on and taking off regular upper body clothing?: A Lot Help from another person to put on and taking off regular lower body clothing?: Total 6 Click Score: 8   End of Session Equipment Utilized During Treatment: Oxygen Nurse Communication: Mobility status  Activity Tolerance: Patient limited by pain;Patient limited by lethargy Patient left: in bed;with call bell/phone within reach;with bed alarm set;with nursing/sitter in room  OT Visit Diagnosis: Unsteadiness on feet (R26.81);Muscle weakness (generalized) (M62.81);Other symptoms and signs involving cognitive function;History of falling (Z91.81);Pain Pain - part of body:  (neck,LLE)                Time: 1478-2956 OT Time Calculation (min): 37 min Charges:  OT General Charges $OT Visit: 1 Visit OT Evaluation $OT Eval Moderate Complexity: 1 Mod  Glenn Henderson, OTR/L [facility] Acute Rehabilitation Office: [phone]   Glenn Henderson 05/11/2023, 4:50 PM

## 2023-05-12 DIAGNOSIS — I469 Cardiac arrest, cause unspecified: Secondary | ICD-10-CM | POA: Diagnosis not present

## 2023-05-12 DIAGNOSIS — S022XXA Fracture of nasal bones, initial encounter for closed fracture: Secondary | ICD-10-CM | POA: Diagnosis not present

## 2023-05-12 MED ORDER — GABAPENTIN 250 MG/5ML PO SOLN
300.0000 mg | Freq: Three times a day (TID) | ORAL | Status: DC
Start: 2023-05-13 — End: 2023-05-19

## 2023-05-12 NOTE — Progress Notes (Signed)
Brief Pharmacy Note  Contacted by Glenn Dolly RN re: pt states he is prescribed oxy IR 30mg  six times daily by Dr Tollie Eth.  A review of the [place] CSRS shows no such dispensed prescription for this pt nor a provider Dr Tollie Eth having prescribed narcotics for this pt.  See narcotic report below.  Of note pt has gabapentin 800mg  Q8H prescribed as outpt and does have it filled regularly while gabapentin 100mg  Q8H has been ordered for inpt use.  Recommended to [facility] MD to increase gabapentin dose.  Will have daytime pharmacist f/u further to determine whether pt has a provider that is not part of the reporting system.    Glenn Henderson, PharmD, BCPS 05/12/2023 11:18 PM

## 2023-05-12 NOTE — Evaluation (Signed)
Clinical/Bedside Swallow Evaluation Patient Details  Name: Glenn Henderson MRN: [id number] Date of Birth: [birth year]  Today's Date: 05/12/2023 Time: SLP Start Time (ACUTE ONLY): 4098 SLP Stop Time (ACUTE ONLY): 0852 SLP Time Calculation (min) (ACUTE ONLY): 15 min  HPI:  Pt is a 57 y.o. 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 CT with type II odontoid fx; 12/19 underwent anterior screw fixation with fluoroscopic guidance and replacement of odontoid screw. Bilateral nasal bone fxs. EEG with severe diffuse encephalopathy. ETT 12/16-21. 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 MR cervical spine 12/18, relevant to swallowing is small prevertebral effusion extending from C1-C5, measuring up to 8 mm.    Assessment / Plan / Recommendation  Clinical Impression  Pt presents with post-surgical likely > post-extubation dysphagia. Most notable feature is the number of sub-swallows observed with single thin liquid bolus (up to ten), suggesting poor bolus efficiency. Coughing was elicited with all attempts to drink liquid.  MR from 12/18 identifies prevertebral effusion extending from C1-C5, measuring up to 8 mm. Effusion in combination with any post-surgical edema around operative site could impact mobility of swallowing structures and limit epiglottic closure over the larynx. He likely needs a few more days before he is ready to eat/drink.   Recommend that [name] remain NPO; allow occasional ice chips for pleasure and to help with integrity of oral mucosa. SLP will follow closely for readiness to resume eating. D/W RN. SLP Visit Diagnosis: Dysphagia, pharyngeal phase (R13.13)    Aspiration Risk  Severe aspiration risk    Diet Recommendation   NPO  Medication Administration: Via alternative  means    Other  Recommendations Oral Care Recommendations: Oral care prior to ice chip/H20    Recommendations for follow up therapy are one component of a multi-disciplinary discharge planning process, led by the attending physician.  Recommendations may be updated based on patient status, additional functional criteria and insurance authorization.  Follow up Recommendations Other (comment) (tba)      Assistance Recommended at Discharge    Functional Status Assessment Patient has had a recent decline in their functional status and demonstrates the ability to make significant improvements in function in a reasonable and predictable amount of time.  Frequency and Duration min 2x/week          Prognosis Prognosis for improved oropharyngeal function: Good      Swallow Study   General Date of Onset: 05/06/23 HPI: Pt is a 57 y.o. 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 CT with type II odontoid fx; 12/19 underwent anterior screw fixation with fluoroscopic guidance and replacement of odontoid screw. Bilateral nasal bone fxs. EEG with severe diffuse encephalopathy. ETT 12/16-21. 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 MR cervical spine 12/18, relevant to swallowing is small prevertebral effusion extending from C1-C5, measuring up to 8 mm. Type of Study: Bedside Swallow Evaluation Previous Swallow Assessment: no Diet Prior to this Study: NPO;Cortrak/Small bore NG tube Temperature Spikes Noted: No Respiratory Status: Nasal cannula History of Recent Intubation: Yes Total duration of intubation (days): 5 days Date extubated: 05/11/23 Behavior/Cognition: Alert Oral Cavity Assessment:  Within Functional Limits (difficult to examine oral cavity with collar in place and positioning  of pt) Oral Care Completed by SLP: Recent completion by staff Oral Cavity - Dentition: Adequate natural dentition Self-Feeding Abilities: Total assist Patient Positioning: Partially reclined Baseline Vocal Quality: Low vocal intensity Volitional Cough: Weak Volitional Swallow: Able to elicit    Oral/Motor/Sensory Function Overall Oral Motor/Sensory Function: Other (comment) (symmetric tongue extension, face at rest)   Ice Chips Ice chips: Impaired Presentation: Spoon Pharyngeal Phase Impairments: Multiple swallows   Thin Liquid Thin Liquid: Impaired Presentation: Straw Pharyngeal  Phase Impairments: Multiple swallows;Cough - Immediate    Nectar Thick Nectar Thick Liquid: Not tested   Honey Thick Honey Thick Liquid: Not tested   Puree Puree: Not tested   Solid     Solid: Not tested      [name] 05/12/2023,9:27 AM   Marchelle Folks L. Samson Frederic, MA CCC/SLP Clinical Specialist - Acute Care SLP Acute Rehabilitation Services Office number [phone]

## 2023-05-12 NOTE — Progress Notes (Signed)
   Providing Compassionate, Quality Care - Together  NEUROSURGERY PROGRESS NOTE   S: No issues overnight.  Extubated.  O: EXAM:  BP 131/70 (BP Location: Left Arm)   Pulse 65   Temp 98.5 F (36.9 C) (Axillary)   Resp (!) 21   Ht 6\' 2"  (1.88 m)   Wt 131.2 kg   SpO2 96%   BMI 37.14 kg/m   Awake, alert Nonlabored breathing Cervical collar in place Following commands in all extremities, except for slight in the left upper extremity  ASSESSMENT:  57 y.o. male with   Type II odontoid fracture status post odontoid screw stabilization  PLAN: -PT OT -Continue collar -Okay for DVT prophylaxis    Thank you for allowing me to participate in this patient's care.  Please do not hesitate to call with questions or concerns.   Monia Pouch, DO Neurosurgeon [facility] [phone]

## 2023-05-12 NOTE — Progress Notes (Signed)
Inpatient Rehab Admissions Coordinator:  ° °Per therapy recommendation,  patient was screened for CIR candidacy by Ltanya Bayley, MS, CCC-SLP. At this time, Pt. Appears to be a a potential candidate for CIR. I will place   order for rehab consult per protocol for full assessment. Please contact me any with questions. ° °Jacy Brocker, MS, CCC-SLP °Rehab Admissions Coordinator  °[phone] (celll) °[phone] (office) ° °

## 2023-05-12 NOTE — Progress Notes (Addendum)
Pt reported to nurse that he is prescribed and takes 30mg  of Roxicodone 6x a day oral, prescribed by Dr. Tollie Eth in [place]. He reports this is his pain doctor. Nurse has contacted [employer] to try to help address the pt's pain needs and reported this information.

## 2023-05-12 NOTE — Progress Notes (Signed)
NAMEBoe Henderson, MRN:  [id number], DOB:  April 23, 1966, LOS: 6 ADMISSION DATE:  05/06/2023, CONSULTATION DATE:  05/06/2023 REFERRING MD:  Dr. Karene Fry - EDP, CHIEF COMPLAINT:  Cardiac arrest    History of Present Illness:  57 year old male with past medical history significant for hyperlipidemia, OSA, history of hepatitis, MDD, anxiety, PTSD, prior tobacco abuse, DM2 with peripheral neuropathy and latest A1c 12.4, chronic radicular lumbar pain with chronic pain syndrome, history of DVT and PE on chronic anticoagulation with Eliquis, TBI 2013 with left-sided deficits, history of seizures, abnormal thyroid disorder, who presented to the ED 12/16 via EMS after suffering out-of-hospital cardiac arrest.  Patient required 2 rounds of CPR prior to ROSC. Per EMS patient did hit head when he arrested, arrived in c-collar. On arrival patient was seen hypertensive, mildly hypothermic and tachycardic. Lab work significant for K 3.4, glucose 219, alkaline phosphastat 150, albumin 3.3, AST 142, ALT 111, lactic 3.0, Hgb 12.0. CT cervical spine on admit positive for acute unstable fracture with epidural hemorrhage. PCCM consulted for further assistance and admisison  Pertinent  Medical History  HTN, OSA, diabetes, prior CVA, TBI with hemipparesis, CKD, DVT and PE   Significant Hospital Events: Including procedures, antibiotic start and stop dates in addition to other pertinent events   12/16 admitted post cardiac arrest 12/17 MRI spine known fracture at base of dens with cord edema, unstable fracture.  12/18: neurosurgery rec surgical stabilization of occiput to C2. Recommending to keep intubated/sedated prior to surgical fixation.  12/19: NAEON. Pending NSGY intervention. Deep sedation.   Interim History / Subjective:  Patient remained afebrile He was successfully extubated yesterday Respiratory culture is growing Pseudomonas He is off Precedex  Objective   Blood pressure 131/70, pulse 65, temperature 98.5  F (36.9 C), temperature source Axillary, resp. rate (!) 21, height 6\' 2"  (1.88 m), weight 131.2 kg, SpO2 96%.        Intake/Output Summary (Last 24 hours) at 05/12/2023 0815 Last data filed at 05/12/2023 0617 Gross per 24 hour  Intake 1775.34 ml  Output 3000 ml  Net -1224.66 ml   Filed Weights   05/10/23 0353 05/11/23 0500 05/12/23 0500  Weight: 130 kg 134.2 kg 131.2 kg    Examination: General: Acute on chronically ill-appearing male, lying on the bed HEENT: [place]/AT, eyes anicteric.  moist mucus membranes.  Neck collar in place Neuro: Alert, awake following commands, weaker on left side (baseline), antigravity on right side Chest: Coarse breath sounds, no wheezes or rhonchi Heart: Regular rate and rhythm, no murmurs or gallops Abdomen: Soft, nontender, nondistended, bowel sounds present Skin: No rash   Labs and images reviewed  Resolved Hospital Problem list    Assessment & Plan:  Status post PEA cardiac arrest of unclear etiology could be related to neurogenic shock after he fell and had spine fracture Echo 12/17 unremarkable, EF 60-65%, no RWMA, normal RV Remained in sinus rhythm EEG ruled out active seizures Supportive care and telemetry monitoring  At acute respiratory failure with hypoxia Multifocal pneumonia with Pseudomonas Hx of OSA Prior tobacco use CT chest confirmed bilateral lower lobe infiltrates likely due to bilateral pneumonia Respiratory culture is growing Pseudomonas He remained afebrile after switching antibiotic to Zosyn from Unasyn Patient was successfully extubated yesterday Titrate oxygen with O2 sat goal 92%  Unstable C-spine C1-C2 fracture with possible epidural hematoma s/p stabilization 12/19 Nasal bone fractures TBI 2013 with left-sided deficits Prior CVA, TBI with hemipparesis ENT recommend outpatient follow-up Appreciate neurosurgery input Continue neck collar  PT/OT evaluation  History of DVT and PE on chronic anticoagulation with  Eliquis, status post IVC filter Currently off anticoagulation Neurosurgery to clear when to restart antibiotics  Poorly controlled type 2 diabetes with hyperglycemia, complicated with peripheral neuropathy.  A1c 10 Still remain hyperglycemic Continue sliding scale insulin Continue tube feed coverage Increase Semglee to 24 units twice daily Monitor fingerstick goal 140-180  Peripheral neuropathy Continue Cymbalta, gabapentin  Anxiety/depression Continue Klonopin  Hypertension Patient blood pressure is better controlled now with clonidine Continue as needed hydralazine  Dysphagia Patient failed swallow evaluation postextubation Continue tube feeds via Cortrak   Best Practice (right click and "Reselect all SmartList Selections" daily)   Diet/type: Continue tube feeds, speech and swallow evaluation postextubation DVT prophylaxis SCD Pressure ulcer(s): N/A GI prophylaxis: H2B Lines: N/A Foley:  Yes, and it is still needed Code Status:  full code Last date of multidisciplinary goals of care discussion: 12/20: Patient family was updated at bedside, decision was to continue full scope of care    Labs   CBC: Recent Labs  Lab 05/08/23 0831 05/09/23 0845 05/10/23 0212 05/11/23 0241 05/12/23 0236  WBC 10.7* 11.5* 12.8* 12.4* 13.3*  NEUTROABS  --   --   --   --  8.9*  HGB 12.0* 11.2* 11.1* 11.9* 11.2*  HCT 37.1* 34.9* 34.4* 36.7* 34.5*  MCV 87.9 89.0 89.6 87.6 87.6  PLT 368 318 305 321 331    Basic Metabolic Panel: Recent Labs  Lab 05/07/23 0703 05/08/23 0304 05/08/23 0831 05/08/23 1733 05/09/23 0845 05/09/23 1735 05/10/23 0212 05/11/23 0241 05/12/23 0236  NA 136  --  135  --  133*  --  137 136 141  K 3.8  --  3.5  --  4.1  --  4.0 3.5 3.6  CL 97*  --  98  --  102  --  103 103 109  CO2 26  --  29  --  24  --  26 25 25   GLUCOSE 169*  --  258*  --  284*  --  226* 257* 209*  BUN 10  --  10  --  12  --  15 15 14   CREATININE 1.06  --  1.03  --  0.98  --  0.99  0.99 0.96  CALCIUM 9.1  --  8.8*  --  8.0*  --  8.3* 8.8* 8.9  MG 2.0 2.0  --  2.0 1.9 1.9  --  2.1 2.0  PHOS 3.3 2.9  --  3.5 4.1 4.2  --   --   --    GFR: Estimated Creatinine Clearance: 125.2 mL/min (by C-G formula based on SCr of 0.96 mg/dL). Recent Labs  Lab 05/06/23 1633 05/07/23 0259 05/09/23 0845 05/10/23 0212 05/11/23 0241 05/12/23 0236  WBC  --    < > 11.5* 12.8* 12.4* 13.3*  LATICACIDVEN 3.0*  --   --   --   --   --    < > = values in this interval not displayed.    Liver Function Tests: Recent Labs  Lab 05/07/23 0922 05/08/23 0831 05/09/23 0845 05/10/23 0212 05/11/23 0241  AST 87* 41 27 24 47*  ALT 143* 100* 63* 53* 79*  ALKPHOS 153* 133* 102 96 137*  BILITOT 0.9 0.5 0.9 0.6 0.7  PROT 7.9 7.4 6.6 6.3* 7.3  ALBUMIN 3.3* 3.0* 2.6* 2.4* 2.5*   No results for input(s): "LIPASE", "AMYLASE" in the last 168 hours. No results for input(s): "AMMONIA" in the last  168 hours.  ABG    Component Value Date/Time   PHART 7.397 05/06/2023 1726   PCO2ART 55.4 (H) 05/06/2023 1726   PO2ART 201 (H) 05/06/2023 1726   HCO3 34.6 (H) 05/06/2023 1726   TCO2 36 (H) 05/06/2023 1726   O2SAT 100 05/06/2023 1726     Coagulation Profile: Recent Labs  Lab 05/06/23 1621  INR 1.2    Cardiac Enzymes: No results for input(s): "CKTOTAL", "CKMB", "CKMBINDEX", "TROPONINI" in the last 168 hours.  HbA1C: Hgb A1c MFr Bld  Date/Time Value Ref Range Status  05/06/2023 08:25 PM 10.0 (H) 4.8 - 5.6 % Final    Comment:    (NOTE) Pre diabetes:          5.7%-6.4%  Diabetes:              >6.4%  Glycemic control for   <7.0% adults with diabetes     CBG: Recent Labs  Lab 05/11/23 1609 05/11/23 2001 05/11/23 2348 05/12/23 0352 05/12/23 0744  GLUCAP 254* 189* 207* 182* 216*   Allergies Allergies  Allergen Reactions   Tape Other (See Comments)    Blisters     Home Medications  Prior to Admission medications   Medication Sig Start Date End Date Taking? Authorizing  Provider  acetaminophen-codeine (TYLENOL #3) 300-30 MG tablet Take 1 tablet by mouth every 4 (four) hours as needed. 04/22/23   [provider]  atorvastatin (LIPITOR) 40 MG tablet Take 40 mg by mouth daily. 04/13/23   [provider]  chlorhexidine (PERIDEX) 0.12 % solution Use as directed 15 mLs in the mouth or throat in the morning, at noon, and at bedtime. 04/22/23   [provider]  clonazePAM (KLONOPIN) 0.5 MG tablet Take 0.5 mg by mouth 2 (two) times daily as needed. 04/12/23   [provider]  cloNIDine (CATAPRES) 0.1 MG tablet Take 0.1 mg by mouth 2 (two) times daily as needed. 04/12/23   [provider]  DULoxetine (CYMBALTA) 30 MG capsule Take 30 mg by mouth every morning. 05/01/23   [provider]  ELIQUIS 5 MG TABS tablet Take 5 mg by mouth 2 (two) times daily. 03/18/23   [provider]  fluticasone (FLONASE) 50 MCG/ACT nasal spray Place 2 sprays into both nostrils at bedtime. 04/28/23   [provider]  furosemide (LASIX) 20 MG tablet Take 20 mg by mouth daily as needed. 04/01/23   [provider]  furosemide (LASIX) 40 MG tablet Take 40 mg by mouth daily as needed. 04/08/23   [provider]  gabapentin (NEURONTIN) 800 MG tablet Take 800 mg by mouth 3 (three) times daily. 04/09/23   [provider]  HUMALOG KWIKPEN 100 UNIT/ML KwikPen Inject 2-10 Units into the skin as directed. 04/08/23   [provider]  hydrochlorothiazide (HYDRODIURIL) 12.5 MG tablet Take 12.5 mg by mouth daily. 04/23/23   [provider]  losartan (COZAAR) 100 MG tablet Take 100 mg by mouth daily. 04/20/23   [provider]  mirtazapine (REMERON) 45 MG tablet Take 45 mg by mouth at bedtime. 05/01/23   [provider]  oxyCODONE-acetaminophen (PERCOCET/ROXICET) 5-325 MG tablet Take 1 tablet by mouth every 6 (six) hours as needed. 04/19/23   [provider]  sildenafil  (REVATIO) 20 MG tablet Take 20 mg by mouth as needed. 04/19/23   [provider]  TRULICITY 1.5 MG/0.5ML SOAJ Inject 1.5 mg into the skin once a week. 03/29/23   [provider]  Vitamin D, Ergocalciferol, (  DRISDOL) 1.25 MG (50000 UNIT) CAPS capsule Take 50,000 Units by mouth once a week. 04/20/23   [provider]       Cheri Fowler, MD [facility] See Amion for pager If no response to pager, please call [phone] until 7pm After 7pm, Please call E-link [phone]

## 2023-05-12 NOTE — Progress Notes (Addendum)
eLink Physician-Brief Progress Note Patient Name: Christiano Ledman DOB: [birth year] MRN: [id number]   Date of Service  05/12/2023  HPI/Events of Note  Patient states he has a pain clinic MD address his pain outpatient and he takes 30mg  oxy 6 times a day. Not on his at home meds list. But, he only has 10mg  Q4 PRN ordered right now. Want to increase the dosage or add anything?   Camera: Obese, neck collar. VS stable.  Discussed with RN.  In pain, not getting relieved by short acting roxi. Home meds of roxi q4 hr prn. As per RN, outside doc meds says 30 mg.   eICU Interventions  Oxycontin IR 10 mg once ordered.  Watch for lethargy.  ABG prn if so. Narcan prn ordered. Asp precautions Extubated on 21 st.      Intervention Category Intermediate Interventions: Pain - evaluation and management  Ranee Gosselin 05/12/2023, 10:28 PM  23:04  Pharmacist checked narcotic prescription habitts online. Last prescribed was in November.  On Gabapentin 800 prn at home q8 hrly - increased gabapentin to 300 mg q8 hr prn via NG tube for now.

## 2023-05-12 NOTE — Progress Notes (Signed)
[facility] ADULT ICU REPLACEMENT PROTOCOL   The patient does apply for the [facility] Adult ICU Electrolyte Replacment Protocol based on the criteria listed below:   1.Exclusion criteria: TCTS, ECMO, Dialysis, and Myasthenia Gravis patients 2. Is GFR >/= 30 ml/min? Yes.    Patient's GFR today is >60 3. Is SCr </= 2? Yes.   Patient's SCr is 0.96 mg/dL 4. Did SCr increase >/= 0.5 in 24 hours? No. 5.Pt's weight >40kg  Yes.   6. Abnormal electrolyte(s): K+ 3.6  7. Electrolytes replaced per protocol 8.  Call MD STAT for K+ </= 2.5, Phos </= 1, or Mag </= 1 Physician:  Dr. Larene Beach, Lilia Argue 05/12/2023 3:33 AM

## 2023-05-12 NOTE — Progress Notes (Signed)
Patient verbalized to RN that he was going to commit suicide and jump out of this building. RN notified MD of patients feelings and suicide sitter was ordered. RN at bedside. Marland Kitchen

## 2023-05-13 DIAGNOSIS — I469 Cardiac arrest, cause unspecified: Secondary | ICD-10-CM | POA: Diagnosis not present

## 2023-05-13 MED ORDER — FREE WATER
200.0000 mL | Status: DC
Start: 2023-05-13 — End: 2023-05-22

## 2023-05-13 NOTE — Progress Notes (Signed)
Physical Therapy Treatment Patient Details Name: Glenn Henderson MRN: [id number] DOB: 1965/08/26 Today's Date: 05/13/2023   History of Present Illness 57 y.o. male admitted 12/16 after cardiac arrest with collapse outside of [employer]; 2 rounds of CPR with ROSC. CT with type II odontoid fx. 12/17 EEG with severe diffuse encephalopathy. 12/19 odontoid screw fixation. 12/21 extubated. PMHx: DMII, OSA, TBI with L weakness, DVT and PE on anticoagulation, hepatitis, peripheral neuropathy, seizures.    PT Comments  Pt perseverating on pain medicine and wanting to get OOB on arrival despite right lean in bed, inability to correct neck or trunk position and LUE paresis. Pt total assist to achieve sitting and needs max cues and assist to utilize RUE to assist with balance and positioning. Pt sat unsupported in foot egress 12 min and initiated standing attempts. Pt unable to clear sacrum to stand or maintain sitting without physical support but continued to ask therapist to bring [facility] to let him get in it without awareness of his deficits or need for assist to chair. Pt continued to have flaccid LUE but able to initiate movement of LLE. Pt encouraged to work on midline posture of neck and trunk in sitting.   HR 126 SPO2 89-94% on RA 170/81   If plan is discharge home, recommend the following: Two people to help with walking and/or transfers;Two people to help with bathing/dressing/bathroom;Assistance with cooking/housework;Direct supervision/assist for medications management;Direct supervision/assist for financial management;Assist for transportation;Help with stairs or ramp for entrance;Assistance with feeding   Can travel by private vehicle        Equipment Recommendations  Hospital bed;Hoyer lift;Wheelchair (measurements PT);Wheelchair cushion (measurements PT)    Recommendations for Other Services       Precautions / Restrictions Precautions Precautions: Cervical;Fall;Other (comment) Precaution  Comments: watch vitals. LUE paresis Required Braces or Orthoses: Cervical Brace Cervical Brace: Soft collar;At all times Restrictions Weight Bearing Restrictions Per Provider Order: No     Mobility  Bed Mobility Overal bed mobility: Needs Assistance Bed Mobility: Rolling, Supine to Sit, Sit to Supine Rolling: Total assist, +2 for physical assistance   Supine to sit: Total assist Sit to supine: Total assist   General bed mobility comments: foot egress positioning of bed to achieve sitting. Pt with right lateral flexion and lean with max assist to achieve and maintain midline. Pt with max cues to push into surface with RUE with pt only able to achieve grossly 1/10 trials. Pt once seated mod assist for midline posture sitting grossly 12 min.  Total +2 to return to supine and slide toward [facility]    Transfers Overall transfer level: Needs assistance   Transfers: Sit to/from Stand Sit to Stand: Mod assist, +2 safety/equipment           General transfer comment: Pt able to attempt to rise from surface with quad contraction of bil legs and both knees blocked x 3 attempts to rise with pt able to achieve partial rise with mod assist and trunk control via RUE support and therapist supporting trunk from elevated surface    Ambulation/Gait                   Stairs             Wheelchair Mobility     Tilt Bed    Modified Rankin (Stroke Patients Only)       Balance Overall balance assessment: Needs assistance Sitting-balance support: Single extremity supported, Feet supported, Feet unsupported Sitting balance-Leahy Scale: Poor Sitting balance -  Comments: mod assist for sitting balance due to right lean                                    Cognition Arousal: Alert Behavior During Therapy: Flat affect Overall Cognitive Status: Impaired/Different from baseline Area of Impairment: Attention, Following commands, Awareness, Safety/judgement, Problem  solving, Memory                   Current Attention Level: Focused Memory: Decreased short-term memory Following Commands: Follows one step commands inconsistently, Follows one step commands with increased time Safety/Judgement: Decreased awareness of safety, Decreased awareness of deficits Awareness: Intellectual Problem Solving: Slow processing, Decreased initiation, Difficulty sequencing, Requires verbal cues, Requires tactile cues General Comments: pt with no awareness of deficits continually stating he can get up and leave, asking P.T. to bring WC despite max assist in sitting an inabilty to stand. Pt perseverating on being able to get OOB but follows intermittent commands with delay        Exercises General Exercises - Upper Extremity Shoulder Flexion: AAROM, Right, Seated, 5 reps, PROM, Left (AAROM on RUE, PROM on LUE)    General Comments        Pertinent Vitals/Pain Pain Assessment Faces Pain Scale: Hurts little more Pain Location: neck Pain Descriptors / Indicators: Aching, Constant, Sore Pain Intervention(s): Limited activity within patient's tolerance, Monitored during session, Premedicated before session, Repositioned    Home Living                          Prior Function            PT Goals (current goals can now be found in the care plan section) Progress towards PT goals: Progressing toward goals    Frequency    Min 1X/week      PT Plan      Co-evaluation              AM-PAC PT "6 Clicks" Mobility   Outcome Measure  Help needed turning from your back to your side while in a flat bed without using bedrails?: Total Help needed moving from lying on your back to sitting on the side of a flat bed without using bedrails?: Total Help needed moving to and from a bed to a chair (including a wheelchair)?: Total Help needed standing up from a chair using your arms (e.g., wheelchair or bedside chair)?: Total Help needed to walk in  hospital room?: Total Help needed climbing 3-5 steps with a railing? : Total 6 Click Score: 6    End of Session   Activity Tolerance: Patient tolerated treatment well Patient left: in bed;with call bell/phone within reach;with nursing/sitter in room Nurse Communication: Mobility status;Need for lift equipment;Precautions PT Visit Diagnosis: Muscle weakness (generalized) (M62.81);Difficulty in walking, not elsewhere classified (R26.2);Other symptoms and signs involving the nervous system (R29.898);Other abnormalities of gait and mobility (R26.89)     Time: 1610-9604 PT Time Calculation (min) (ACUTE ONLY): 29 min  Charges:    $Therapeutic Activity: 23-37 mins PT General Charges $$ ACUTE PT VISIT: 1 Visit                     Merryl Hacker, PT Acute Rehabilitation Services Office: [phone]    Enedina Finner Olyvia Gopal 05/13/2023, 10:29 AM

## 2023-05-13 NOTE — TOC Progression Note (Signed)
Transition of Care [facility]) - Progression Note    Patient Details  Name: Glenn Henderson MRN: [id number] Date of Birth: 11-Nov-1965  Transition of Care [facility] CM/SW Contact  Nicanor Bake Phone Number: [phone] 05/13/2023, 4:12 PM  Clinical Narrative:   HF CSW left a copy of IVC on pts chart.   TOC will continue following.     Expected Discharge Plan:  (TBD)    Expected Discharge Plan and Services   Discharge Planning Services: CM Consult   Living arrangements for the past 2 months: Single Family Home                                       Social Determinants of Health (SDOH) Interventions SDOH Screenings   Food Insecurity: Patient Unable To Answer (05/07/2023)  Housing: Patient Unable To Answer (05/07/2023)  Transportation Needs: Patient Unable To Answer (05/07/2023)  Utilities: Patient Unable To Answer (05/07/2023)  Tobacco Use: High Risk (05/07/2023)    Readmission Risk Interventions     No data to display

## 2023-05-13 NOTE — Progress Notes (Signed)
Inpatient Rehab Admissions Coordinator:   Met with pt briefly at bedside to discuss rehab recommendations.  He keeps his eyes closed and gives 1 word responses.  Breathless.  Significant differences in his report of home set up (states he lives with both of his parents as well his sister and uncle and a brother).  Note reports of SI, now with sitter and IVC/inpatient psych recs, which are barriers to CIR.  I will try to contact his mother to discuss discharge options and caregiver support as psych expects him to progress past these recommendations.   Estill Dooms, PT, DPT Admissions Coordinator [phone] 05/13/23  3:34 PM

## 2023-05-13 NOTE — Progress Notes (Signed)
Speech Language Pathology Treatment: Dysphagia  Patient Details Name: Glenn Henderson MRN: [id number] DOB: May 28, 1965 Today's Date: 05/13/2023 Time: 4259-5638 SLP Time Calculation (min) (ACUTE ONLY): 20 min  Assessment / Plan / Recommendation Clinical Impression  Pt just arrived to 6E.  Sitter present.  Assisted with repositioning pt in bed.  Oral suctioning set-up. Glenn Henderson verbalizing minimally - voice and cued cough are wet/congested. He has difficulty expectorating secretions with adequate force.  Provided with trials of ice chips and teaspoons of water -  no improvement from yesterday. Continues to generate multiple sub-swallows (8-10) after single bolus and coughs consistently.  D/W pt - needs more time for healing s/p surgery.  Continue NPO except occasional ice chips after oral care.  SLP will follow.   HPI HPI: Pt is a 57 y.o. 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 CT with type II odontoid fx; 12/19 underwent anterior screw fixation with fluoroscopic guidance and replacement of odontoid screw. Bilateral nasal bone fxs. EEG with severe diffuse encephalopathy. ETT 12/16-21. 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 MR cervical spine 12/18, relevant to swallowing is small prevertebral effusion extending from C1-C5, measuring up to 8 mm.      SLP Plan  Continue with current plan of care      Recommendations for follow up therapy are one component of a multi-disciplinary discharge planning process, led by the attending physician.  Recommendations may be updated based on patient status, additional functional criteria and insurance authorization.    Recommendations  Diet recommendations: NPO Medication Administration: Via alternative means   Allow occasional ice chips after  oral care.                 Oral care prior to ice chip/H20     Dysphagia, pharyngeal phase (R13.13)     Continue with current plan of care   Skyleigh Windle L. Samson Frederic, MA CCC/SLP Clinical Specialist - Acute Care SLP Acute Rehabilitation Services Office number [phone]   [name]  05/13/2023, 2:39 PM

## 2023-05-13 NOTE — Progress Notes (Signed)
[facility] ADULT ICU REPLACEMENT PROTOCOL   The patient does apply for the [facility] Adult ICU Electrolyte Replacment Protocol based on the criteria listed below:   1.Exclusion criteria: TCTS, ECMO, Dialysis, and Myasthenia Gravis patients 2. Is GFR >/= 30 ml/min? Yes.    Patient's GFR today is >60 3. Is SCr </= 2? Yes.   Patient's SCr is 0.97 mg/dL 4. Did SCr increase >/= 0.5 in 24 hours? No. 5.Pt's weight >40kg  Yes.   6. Abnormal electrolyte(s): K  7. Electrolytes replaced per protocol 8.  Call MD STAT for K+ </= 2.5, Phos </= 1, or Mag </= 1 Physician:  Alpha Gula [facility] 05/13/2023 5:12 AM

## 2023-05-13 NOTE — Progress Notes (Signed)
Patient ID: Glenn Henderson, male   DOB: December 04, 1965, 57 y.o.   MRN: [id number] Patient awake and alert Motor function is sporadic in left upper extremity. More consistent in both lower extremities and right upper extremity. Pain management issues noted. Will need rehab.

## 2023-05-13 NOTE — Progress Notes (Addendum)
CSW assisted Deandra, LCSWA in filing for patient's IVC.   Envelope [id number], case #[id number].  Edwin Dada, MSW, LCSW Transitions of Care  Clinical Social Worker II [phone]
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
## 2023-05-13 NOTE — Progress Notes (Signed)
   05/13/23 1628  Spiritual Encounters  Type of Visit Initial  Care provided to: Patient  Referral source Physician  Reason for visit Routine spiritual support  OnCall Visit No  Spiritual Framework  Presenting Themes Values and beliefs;Significant life change  Values/beliefs Belief in god and prayer  Patient Stress Factors Health changes;Major life changes  Family Stress Factors Not reviewed  Interventions  Spiritual Care Interventions Made Established relationship of care and support;Compassionate presence;Reflective listening;Prayer;Encouragement  Intervention Outcomes  Outcomes Connection to spiritual care;Awareness of support;Reduced isolation  Spiritual Care Plan  Spiritual Care Issues Still Outstanding No further spiritual care needs at this time (see row info)   Chaplain responded to spiritual consult. Patient struggled to speak and to breath. Patient requested prayer for healing. Chaplain prayed with the patient and provided encouragement.   Arlyce Dice, Chaplain Resident
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
## 2023-05-13 NOTE — Plan of Care (Signed)
  Problem: Coping: Goal: Ability to adjust to condition or change in health will improve Outcome: Progressing   Problem: Fluid Volume: Goal: Ability to maintain a balanced intake and output will improve Outcome: Progressing   Problem: Nutritional: Goal: Maintenance of adequate nutrition will improve Outcome: Progressing   Problem: Skin Integrity: Goal: Risk for impaired skin integrity will decrease Outcome: Progressing   

## 2023-05-13 NOTE — Progress Notes (Addendum)
PROGRESS NOTE    Glenn Henderson  JXB:[id number] DOB: 1966-01-04 DOA: 05/06/2023 PCP: Fleet Contras, MD   Brief Narrative: No notes on file   Assessment and Plan:  S/p PEA cardiac arrest Out-of-hospital event. Unclear etiology. EEG without evidence of seizures. Transthoracic Echocardiogram obtained and was significant for normal RV, LVEF of 60-65% and no regional wall motion abnormality.  Acute respiratory failure with hypoxia Secondary to cardiac arrest. Patient required mechanical ventilation from 12/16 until 12/21 when he was extubated successfully.  Multifocal pneumonia CT chest significant for bilateral lower lobe infiltrates consistent with pneumonia with culture positive for pseudomonas and now positive for enterobacter cloacae. -Transition Zosyn IV to Cefepime IV  Unstable C-spine C1-C2 fracture Patient evaluated by neurosurgery with odontoid screw fixation performed on 12/19 with subsequent need for replacement of odontoid screw. Recommendation for soft cervical collar. Plan for inpatient rehab once medically stable for discharge.  Nasal bone fractures Noted incidentally on CT imaging. Secondary to fall with head trauma. ENT with recommendations for no intervention.  Suicidal ideation Patient expressed suicidal ideation on 12/22 -Follow-up psychiatry recommendations  History of CVA History of TBI Patient with residual left-sided deficits with hemiparesis.  History of DVT/PE Patient is on Eliquis as an outpatient which was held on admission. Neurosurgery cleared patient for resumption of DVT prophylaxis but will need to clarify if able to restart full dose anticoagulation.  Diabetes mellitus type 2 Uncontrolled with hyperglycemia. Hemoglobin A1C of 10%. -Continue Semglee and SSI  Peripheral neuropathy Patient is on gabapentin and Cymbalta as an outpatient. -Continue Cymbalta and gabapentin  Anxiety Depression -Continue Klonopin  Chronic pain Noted. Patient  reports being prescribed oxycodone by Dr. Tollie Eth in [place], [place]. Per PDMP review, patient has not been filling reported prescription of oxycodone 30 mg 6 times daily. Unclear of validity.  Primary hypertension -Continue clonidine BID  Dysphagia Patient failed swallow study postextubation. Speech therapy reevaluated on 12/23 with continued recommendations for NPO. Will need to continue NG tube for nutrition until dysphagia improves.    DVT prophylaxis: Lovenox Code Status:   Code Status: Full Code Family Communication: None at bedside Disposition Plan: Discharge to acute inpatient rehabilitation pending improved vitals/stability and pain tolerance   Consultants:  PCCM Neurosurgery General surgery ENT Psychiatry  Procedures:    Antimicrobials:     Subjective: Patient reports chest pain and rectal pain. Patient is concerned that his pain is not adequately being treated as he has a high pain tolerance. Patient states that if we won't treat his pain, he plans to leave the hospital. When asking about patient's breathing, he references his weight.  Objective: BP (!) 159/73   Pulse (!) 121   Temp 98.6 F (37 C) (Axillary)   Resp (!) 34   Ht 6\' 2"  (1.88 m)   Wt 131.4 kg   SpO2 95%   BMI 37.19 kg/m   Examination:  General exam: Appears calm and comfortable. Chronically ill appearing. Respiratory system: Coarse breath sounds bilaterally. No wheezing or rales noted. Tachypnea. Cardiovascular system: S1 & S2 heard, tachycardia, normal rhythm. Gastrointestinal system: Abdomen is nondistended, soft and nontender. Normal bowel sounds heard. Central nervous system: Alert and oriented. Left-sided weakness compared to right Musculoskeletal: BLE edema. No calf tenderness Psychiatry: Judgement and insight appear normal. Mood & affect appropriate.    Data Reviewed: I have personally reviewed following labs and imaging studies  CBC Lab Results  Component Value Date   WBC 13.7  (H) 05/13/2023   RBC 4.22 05/13/2023  HGB 12.1 (L) 05/13/2023   HCT 37.6 (L) 05/13/2023   MCV 89.1 05/13/2023   MCH 28.7 05/13/2023   PLT 351 05/13/2023   MCHC 32.2 05/13/2023   RDW 14.0 05/13/2023   LYMPHSABS 2.1 05/12/2023   MONOABS 1.9 (H) 05/12/2023   EOSABS 0.3 05/12/2023   BASOSABS 0.1 05/12/2023     Last metabolic panel Lab Results  Component Value Date   NA 142 05/13/2023   K 3.6 05/13/2023   CL 109 05/13/2023   CO2 22 05/13/2023   BUN 15 05/13/2023   CREATININE 0.97 05/13/2023   GLUCOSE 126 (H) 05/13/2023   GFRNONAA >60 05/13/2023   CALCIUM 8.6 (L) 05/13/2023   PHOS 2.7 05/13/2023   PROT 7.3 05/11/2023   ALBUMIN 2.5 (L) 05/11/2023   BILITOT 0.7 05/11/2023   ALKPHOS 137 (H) 05/11/2023   AST 47 (H) 05/11/2023   ALT 79 (H) 05/11/2023   ANIONGAP 11 05/13/2023    GFR: Estimated Creatinine Clearance: 124 mL/min (by C-G formula based on SCr of 0.97 mg/dL).  Recent Results (from the past 240 hours)  MRSA Next Gen by PCR, Nasal     Status: None   Collection Time: 05/06/23  8:12 PM   Specimen: Nasal Mucosa; Nasal Swab  Result Value Ref Range Status   MRSA by PCR Next Gen NOT DETECTED NOT DETECTED Final    Comment: (NOTE) The GeneXpert MRSA Assay (FDA approved for NASAL specimens only), is one component of a comprehensive MRSA colonization surveillance program. It is not intended to diagnose MRSA infection nor to guide or monitor treatment for MRSA infections. Test performance is not FDA approved in patients less than 33 years old. Performed at [facility], [address], [place], [place] [address]   Culture, Respiratory w Gram Stain     Status: None (Preliminary result)   Collection Time: 05/10/23 12:32 PM   Specimen: Tracheal Aspirate; Respiratory  Result Value Ref Range Status   Specimen Description TRACHEAL ASPIRATE  Final   Special Requests NONE  Final   Gram Stain   Final    ABUNDANT WBC PRESENT, PREDOMINANTLY PMN RARE GRAM NEGATIVE  RODS RARE BUDDING YEAST SEEN    Culture   Final    ABUNDANT GRAM NEGATIVE RODS RARE PSEUDOMONAS AERUGINOSA CULTURE REINCUBATED FOR BETTER GROWTH Performed at [facility], [address] [place], [place] [address]    Report Status PENDING  Incomplete      Radiology Studies: DG CHEST PORT 1 VIEW Result Date: 05/12/2023 CLINICAL DATA:  Intubation EXAM: PORTABLE CHEST 1 VIEW COMPARISON:  Chest CT from 2 days ago FINDINGS: Improved aeration, especially at the right upper lobe. Interval tracheal extubation. Lung volumes are low and there is blunting of the costophrenic sulci attributed atelectasis by prior CT. No pneumothorax. Cardiomegaly. IMPRESSION: Extubation with low volume lungs although improved from prior. Atelectasis at the lung bases. Electronically Signed   By: Tiburcio Pea M.D.   On: 05/12/2023 08:35      LOS: 7 days    Jacquelin Hawking, MD [facility] 05/13/2023, 7:05 AM   If 7PM-7AM, please contact night-coverage [email or web address]

## 2023-05-14 DIAGNOSIS — J9601 Acute respiratory failure with hypoxia: Secondary | ICD-10-CM | POA: Diagnosis not present

## 2023-05-14 DIAGNOSIS — R45851 Suicidal ideations: Secondary | ICD-10-CM

## 2023-05-14 DIAGNOSIS — J189 Pneumonia, unspecified organism: Secondary | ICD-10-CM

## 2023-05-14 DIAGNOSIS — F05 Delirium due to known physiological condition: Secondary | ICD-10-CM

## 2023-05-14 DIAGNOSIS — G894 Chronic pain syndrome: Secondary | ICD-10-CM

## 2023-05-14 DIAGNOSIS — R7989 Other specified abnormal findings of blood chemistry: Secondary | ICD-10-CM

## 2023-05-14 DIAGNOSIS — I469 Cardiac arrest, cause unspecified: Secondary | ICD-10-CM | POA: Diagnosis not present

## 2023-05-14 DIAGNOSIS — I10 Essential (primary) hypertension: Secondary | ICD-10-CM

## 2023-05-14 NOTE — Progress Notes (Signed)
Speech Language Pathology Treatment: Dysphagia  Patient Details Name: Glenn Henderson MRN: [id number] DOB: 23-Mar-1966 Today's Date: 05/14/2023 Time: 4098-1191 SLP Time Calculation (min) (ACUTE ONLY): 20 min  Assessment / Plan / Recommendation Clinical Impression  Pt seen for dysphagia intervention. He was initially irritated and distracted by something the sitter had said that he appeared to misunderstand but was able to be consoled an redirected by SLP. Pt noted to have continued decreased  secretion management with coughing on secretions prior to po's requiring oral suctioning. Volitional cough was weak and non productive. Slight improvements in response to ice chips and water via teaspoon and several from cup compared to prior sessions. He did demonstrate sub swallows however with decreased frequency (2-3 vs 8-10) and reflexive response to likely airway intrusion was immediate throat clears vs coughs. His vocal quality remained clear following trials. He complained of neck pain and was repositioned several times throughout session. Pt making some progress towards goals however not ready for instrumental assessment yet and will benefit from increased time for cervical edema to decrease. Pt can have ice chips sitting upright after oral care. ST will continue to follow.    HPI HPI: Pt is a 57 y.o. 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 CT with type II odontoid fx; 12/19 underwent anterior screw fixation with fluoroscopic guidance and replacement of odontoid screw. Bilateral nasal bone fxs. EEG with severe diffuse encephalopathy. ETT 12/16-21. 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 MR cervical spine 12/18, relevant to swallowing is small prevertebral effusion extending from C1-C5,  measuring up to 8 mm.      SLP Plan  Continue with current plan of care      Recommendations for follow up therapy are one component of a multi-disciplinary discharge planning process, led by the attending physician.  Recommendations may be updated based on patient status, additional functional criteria and insurance authorization.    Recommendations  Diet recommendations: NPO;Other(comment) (ice chips PRN after oral care) Medication Administration: Via alternative means                  Oral care prior to ice chip/H20   Frequent or constant Supervision/Assistance Dysphagia, pharyngeal phase (R13.13)     Continue with current plan of care     Royce Macadamia  05/14/2023, 1:53 PM

## 2023-05-14 NOTE — Hospital Course (Addendum)
Glenn Henderson is a 57 y.o. male with a history of hyperlipidemia, OSA, hepatitis, major depressive disorder, anxiety, PTSD, tobacco use, diabetes mellitus type 2, peripheral neuropathy, chronic radicular lumbar pain with chronic pain syndrome.  Patient presented secondary to out of hospital PEA arrest requiring 2 rounds of CPR prior to ROSC.  Patient intubated and admitted to the ICU for management of respiratory failure.  Patient also suffered a cervical spine fracture involving C1 requiring odontoid screw fixation by neurosurgery.  Hospitalization further complicated by multifocal pneumonia, pain management, delirium, suicidal ideation.At this point patient is for acute inpatient rehabilitation pending improvement in vitals and pain tolerance.

## 2023-05-14 NOTE — Progress Notes (Addendum)
PROGRESS NOTE    Glenn Henderson  WUJ:[id number] DOB: [birth year] DOA: 05/06/2023 PCP: Fleet Contras, MD   Brief Narrative: Glenn Henderson is a 57 y.o. male with a history of hyperlipidemia, OSA, hepatitis, major depressive disorder, anxiety, PTSD, tobacco use, diabetes mellitus type 2, peripheral neuropathy, chronic radicular lumbar pain with chronic pain syndrome.  Patient presented secondary to out of hospital PEA arrest requiring 2 rounds of CPR prior to ROSC.  Patient intubated and admitted to the ICU for management of respiratory failure.  Patient also suffered a cervical spine fracture involving C1 requiring odontoid screw fixation by neurosurgery.  Hospitalization further complicated by multifocal pneumonia, pain management, delirium, suicidal ideation.   Assessment and Plan:  S/p PEA cardiac arrest Out-of-hospital event. Unclear etiology. EEG without evidence of seizures. Transthoracic Echocardiogram obtained and was significant for normal RV, LVEF of 60-65% and no regional wall motion abnormality.  Acute respiratory failure with hypoxia Secondary to cardiac arrest. Patient required mechanical ventilation from 12/16 until 12/21 when he was extubated successfully.  Multifocal pneumonia CT chest significant for bilateral lower lobe infiltrates consistent with pneumonia with culture positive for pseudomonas and now positive for enterobacter cloacae. -Continue Cefepime IV  Low TSH Unclear of the etiology. -Check free T4 and T3 -Since patient has sinus tachycardia, will start metoprolol tartrate 25 mg BID  Delirium New. Possibly related to psych-related medications. Also with elevated temperature of unknown etiology. -Delirium precautions  Unstable C-spine C1-C2 fracture Patient evaluated by neurosurgery with odontoid screw fixation performed on 12/19 with subsequent need for replacement of odontoid screw. Recommendation for soft cervical collar. Plan for inpatient rehab once  medically stable for discharge.  Nasal bone fractures Noted incidentally on CT imaging. Secondary to fall with head trauma. ENT with recommendations for no intervention.  Suicidal ideation Patient expressed suicidal ideation on 12/22 -Psychiatry recommendations: IVC, Effexor, Seroquel. New recommendations pending today  History of CVA History of TBI Patient with residual left-sided deficits with hemiparesis.  History of DVT/PE Patient is on Eliquis as an outpatient which was held on admission. Neurosurgery cleared patient for resumption of DVT prophylaxis but will need to clarify if able to restart full dose anticoagulation.  Elevated AST/ALT Unclear etiology. No obvious symptoms. Patient does have an elevated temperature, but no pain located in RUQ. [place] this admission significant for evidence of hepatic steatosis and hepatocellular disease. -Hold Lipitor -Check CK  Diabetes mellitus type 2 Uncontrolled with hyperglycemia. Hemoglobin A1C of 10%. -Continue Semglee and SSI  Peripheral neuropathy Patient is on gabapentin and Cymbalta as an outpatient. -Cymbalta held by psychiatry -Continue gabapentin  Anxiety Depression -Continue Klonopin  Chronic pain Noted. Patient reports being prescribed oxycodone by Dr. Tollie Eth in [place], [place]. Per PDMP review, patient has not been filling reported prescription of oxycodone 30 mg 6 times daily. Unclear of validity.  Primary hypertension -Continue clonidine BID  Dysphagia Patient failed swallow study postextubation. Speech therapy reevaluated on 12/23 with continued recommendations for NPO. Will need to continue NG tube for nutrition until dysphagia improves.    DVT prophylaxis: Lovenox Code Status:   Code Status: Full Code Family Communication: None at bedside Disposition Plan: Discharge to acute inpatient rehabilitation pending improved vitals/stability and pain tolerance   Consultants:  PCCM Neurosurgery General  surgery ENT Psychiatry  Procedures:    Antimicrobials:     Subjective: Patient reports no dyspnea or chest pain. He is wanting pain medication through his IV. Patient is requesting to go home again and reports that he saw his children  yesterday. When asked about how he saw his children, he stated that he went home yesterday and returned.  Objective: BP (!) 163/90 (BP Location: Left Arm)   Pulse (!) 116   Temp 99.3 F (37.4 C) (Oral)   Resp (!) 41   Ht 6\' 2"  (1.88 m)   Wt 97.9 kg   SpO2 97%   BMI 27.71 kg/m   Examination:  General exam: Appears calm and comfortable Respiratory system: Rhonchi and rales bilaterally. Tachypnea Cardiovascular system: S1 & S2 heard, RRR. No murmurs, rubs, gallops or clicks. Gastrointestinal system: Abdomen is soft and nontender. Normal bowel sounds heard. Central nervous system: Alert and oriented to person. Knows he is in [place] but now the hospital. Musculoskeletal: BLE edema. No calf tenderness Skin: No cyanosis. No rashes   Data Reviewed: I have personally reviewed following labs and imaging studies  CBC Lab Results  Component Value Date   WBC 12.1 (H) 05/14/2023   RBC 4.21 (L) 05/14/2023   HGB 11.9 (L) 05/14/2023   HCT 36.7 (L) 05/14/2023   MCV 87.2 05/14/2023   MCH 28.3 05/14/2023   PLT 401 (H) 05/14/2023   MCHC 32.4 05/14/2023   RDW 14.2 05/14/2023   LYMPHSABS 2.3 05/14/2023   MONOABS 1.9 (H) 05/14/2023   EOSABS 0.4 05/14/2023   BASOSABS 0.2 (H) 05/14/2023     Last metabolic panel Lab Results  Component Value Date   NA 142 05/13/2023   K 3.6 05/13/2023   CL 109 05/13/2023   CO2 22 05/13/2023   BUN 15 05/13/2023   CREATININE 0.97 05/13/2023   GLUCOSE 126 (H) 05/13/2023   GFRNONAA >60 05/13/2023   CALCIUM 8.6 (L) 05/13/2023   PHOS 2.7 05/13/2023   PROT 7.3 05/11/2023   ALBUMIN 2.5 (L) 05/11/2023   BILITOT 0.7 05/11/2023   ALKPHOS 137 (H) 05/11/2023   AST 47 (H) 05/11/2023   ALT 79 (H) 05/11/2023    ANIONGAP 11 05/13/2023    GFR: Estimated Creatinine Clearance: 100 mL/min (by C-G formula based on SCr of 0.97 mg/dL).  Recent Results (from the past 240 hours)  MRSA Next Gen by PCR, Nasal     Status: None   Collection Time: 05/06/23  8:12 PM   Specimen: Nasal Mucosa; Nasal Swab  Result Value Ref Range Status   MRSA by PCR Next Gen NOT DETECTED NOT DETECTED Final    Comment: (NOTE) The GeneXpert MRSA Assay (FDA approved for NASAL specimens only), is one component of a comprehensive MRSA colonization surveillance program. It is not intended to diagnose MRSA infection nor to guide or monitor treatment for MRSA infections. Test performance is not FDA approved in patients less than 33 years old. Performed at [facility], [address], [place], [place] [address]   Culture, Respiratory w Gram Stain     Status: None   Collection Time: 05/10/23 12:32 PM   Specimen: Tracheal Aspirate; Respiratory  Result Value Ref Range Status   Specimen Description TRACHEAL ASPIRATE  Final   Special Requests NONE  Final   Gram Stain   Final    ABUNDANT WBC PRESENT, PREDOMINANTLY PMN RARE GRAM NEGATIVE RODS RARE BUDDING YEAST SEEN Performed at [facility], [address], [place], [place] [address]    Culture   Final    ABUNDANT ENTEROBACTER CLOACAE RARE PSEUDOMONAS AERUGINOSA    Report Status 05/13/2023 FINAL  Final   Organism ID, Bacteria ENTEROBACTER CLOACAE  Final   Organism ID, Bacteria PSEUDOMONAS AERUGINOSA  Final  Susceptibility   Enterobacter cloacae - MIC*    CEFEPIME <=0.12 SENSITIVE Sensitive     CEFTAZIDIME <=1 SENSITIVE Sensitive     CIPROFLOXACIN <=0.25 SENSITIVE Sensitive     GENTAMICIN <=1 SENSITIVE Sensitive     IMIPENEM <=0.25 SENSITIVE Sensitive     TRIMETH/SULFA <=20 SENSITIVE Sensitive     PIP/TAZO <=4 SENSITIVE Sensitive ug/mL    * ABUNDANT ENTEROBACTER CLOACAE   Pseudomonas aeruginosa - MIC*    CEFTAZIDIME 4 SENSITIVE Sensitive     CIPROFLOXACIN  0.5 SENSITIVE Sensitive     GENTAMICIN <=1 SENSITIVE Sensitive     IMIPENEM 1 SENSITIVE Sensitive     CEFEPIME 4 SENSITIVE Sensitive     * RARE PSEUDOMONAS AERUGINOSA      Radiology Studies: DG CHEST PORT 1 VIEW Result Date: 05/13/2023 CLINICAL DATA:  History of endotracheal tube. EXAM: PORTABLE CHEST 1 VIEW COMPARISON:  05/12/2023 FINDINGS: Again noted are low lung volumes. Hazy densities in lower lungs likely represent atelectasis. Cardiac silhouette is enlarged but may be accentuated by the technique. Surgical staples in the lower neck. Feeding tube extends into the abdomen but the tip is beyond the image. Trachea is midline. No evidence for an endotracheal tube. IMPRESSION: 1. Low lung volumes and probable basilar atelectasis. Electronically Signed   By: Richarda Overlie M.D.   On: 05/13/2023 10:41      LOS: 8 days    Jacquelin Hawking, MD [facility] 05/14/2023, 8:31 AM   If 7PM-7AM, please contact night-coverage [email or web address]

## 2023-05-14 NOTE — Progress Notes (Addendum)
Inpatient Rehab Admissions Coordinator:   Talked to pt's RN this AM.  He is about the same as far as confusion and difficulty with mobility.  I left voicemail for his mom to discuss rehab recommendations and caregiver support.   1553: Note still IVC'd but no longer recommendations for inpatient pysch.   Estill Dooms, PT, DPT Admissions Coordinator [phone] 05/14/23  3:22 PM
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
## 2023-05-14 NOTE — Progress Notes (Signed)
Physical Therapy Treatment Patient Details Name: Glenn Henderson MRN: [id number] DOB: Jan 12, 1966 Today's Date: 05/14/2023   History of Present Illness 57 y.o. male admitted 12/16 after cardiac arrest with collapse outside of [employer] shop; 2 rounds of CPR with ROSC. CT with type II odontoid fx. 12/17 EEG with severe diffuse encephalopathy. 12/19 odontoid screw fixation. 12/21 extubated. PMHx: DMII, OSA, TBI with L weakness, DVT and PE on anticoagulation, hepatitis, peripheral neuropathy, seizures.    PT Comments  Pt received in supine, asking about pain meds. Pt thinks he is at a hotel and that he left in his car yesterday. Pt redirected. Required tot A +2 to come to sitting EOB and return to supine. Able to move RUE and RLE but still has difficulty using functionally. Pt able to move L ankle and extend L knee partially. Worked on sit>stand x4 with RW and mod A +2. Pt able to achieve partial stand. Also worked on trunk control in sitting with reaching and core activities. Patient will benefit from intensive inpatient follow up therapy, >3 hours/day. PT will continue to follow.     If plan is discharge home, recommend the following: Two people to help with walking and/or transfers;Two people to help with bathing/dressing/bathroom;Assistance with cooking/housework;Direct supervision/assist for medications management;Direct supervision/assist for financial management;Assist for transportation;Help with stairs or ramp for entrance;Assistance with feeding   Can travel by private vehicle        Equipment Recommendations  Hospital bed;Hoyer lift;Wheelchair (measurements PT);Wheelchair cushion (measurements PT)    Recommendations for Other Services Rehab consult     Precautions / Restrictions Precautions Precautions: Cervical;Fall;Other (comment) Precaution Booklet Issued: No Precaution Comments: watch vitals. LUE paresis Required Braces or Orthoses: Cervical Brace Cervical Brace: Soft collar;At all  times Restrictions Weight Bearing Restrictions Per Provider Order: No     Mobility  Bed Mobility Overal bed mobility: Needs Assistance Bed Mobility: Rolling, Supine to Sit, Sit to Supine Rolling: Total assist, +2 for physical assistance Sidelying to sit: Total assist, +2 for physical assistance, +2 for safety/equipment, HOB elevated   Sit to supine: Total assist, +2 for physical assistance   General bed mobility comments: assisted pt in rolling to L to be able to use R side, unable to grasp and hold rail with R hand and needed assist bridging R knee.Tot A for LE's off EOB and elevation of trunk into sititng. Pt unable to scoot hips to EOB and can move RUE but not using in a meaningful way. Pt not using LUE at all but is able to hold it in a static position at times    Transfers Overall transfer level: Needs assistance   Transfers: Sit to/from Stand Sit to Stand: Mod assist, +2 safety/equipment           General transfer comment: Pt able to attempt to rise from surface with quad contraction of bil legs and both knees blocked x 4 attempts to rise with pt able to achieve partial rise with mod assist and trunk control via RUE support on RW and therapist supporting trunk from elevated surface. Initiates fwd wt shift but cannot fully extend through hips or knees    Ambulation/Gait               General Gait Details: unable   Stairs             Wheelchair Mobility     Tilt Bed    Modified Rankin (Stroke Patients Only)       Balance Overall balance assessment:  Needs assistance Sitting-balance support: Single extremity supported, Feet supported, Feet unsupported Sitting balance-Leahy Scale: Poor Sitting balance - Comments: R  and posterior lean in sitting. Mod A most of the time with min A at times Postural control: Posterior lean, Right lateral lean Standing balance support: Bilateral upper extremity supported, During functional activity Standing  balance-Leahy Scale: Zero Standing balance comment: unable to achieve full standing               High Level Balance Comments: worked on reaching activities with RUE in sitting. ALso worked on pt using IS in sitting, needed assist to hold with R hand. Pt unable to move blue accordion. Then worked on making sucking position with lips and pretending to suck through straw. Breathing sounds better in sitting than supine            Cognition Arousal: Alert Behavior During Therapy: Flat affect Overall Cognitive Status: Impaired/Different from baseline Area of Impairment: Attention, Following commands, Awareness, Safety/judgement, Problem solving, Memory, Orientation                 Orientation Level: Disoriented to, Place, Time, Situation Current Attention Level: Focused Memory: Decreased short-term memory Following Commands: Follows one step commands inconsistently, Follows one step commands with increased time Safety/Judgement: Decreased awareness of safety, Decreased awareness of deficits Awareness: Intellectual Problem Solving: Slow processing, Decreased initiation, Difficulty sequencing, Requires verbal cues, Requires tactile cues General Comments: pt thinks he's in a hotel. Thinks he's turning a year in his 60's on his birthday this week. Thinks he left the hospital yesterday. Keeps asking for pain meds even after RN told him when he got his meds and when he can get them again. Gets mildly agitated at times about this        Exercises General Exercises - Upper Extremity Shoulder Flexion:  (AAROM on RUE, PROM on LUE) General Exercises - Lower Extremity Ankle Circles/Pumps: AROM, Both, 10 reps, Supine Quad Sets: AAROM, Left, 5 reps, Supine Long Arc Quad: Both, AROM, 5 reps, Seated    General Comments General comments (skin integrity, edema, etc.): HR up to 99 bpm. O2 sats stable on 1L.      Pertinent Vitals/Pain Pain Assessment Pain Assessment: 0-10 Pain Score:  10-Worst pain ever Pain Location: neck, shoulders Pain Descriptors / Indicators: Aching, Constant, Sore Pain Intervention(s): Patient requesting pain meds-RN notified, Limited activity within patient's tolerance, Monitored during session, Premedicated before session    Home Living                          Prior Function            PT Goals (current goals can now be found in the care plan section) Acute Rehab PT Goals Patient Stated Goal: to walk again PT Goal Formulation: With patient Time For Goal Achievement: 05/25/23 Potential to Achieve Goals: Fair Progress towards PT goals: Progressing toward goals    Frequency    Min 1X/week      PT Plan      Co-evaluation PT/OT/SLP Co-Evaluation/Treatment: Yes Reason for Co-Treatment: Complexity of the patient's impairments (multi-system involvement);Necessary to address cognition/behavior during functional activity;For patient/therapist safety;To address functional/ADL transfers PT goals addressed during session: Mobility/safety with mobility;Balance OT goals addressed during session: Strengthening/ROM      AM-PAC PT "6 Clicks" Mobility   Outcome Measure  Help needed turning from your back to your side while in a flat bed without using bedrails?: Total Help needed moving from  lying on your back to sitting on the side of a flat bed without using bedrails?: Total Help needed moving to and from a bed to a chair (including a wheelchair)?: Total Help needed standing up from a chair using your arms (e.g., wheelchair or bedside chair)?: Total Help needed to walk in hospital room?: Total Help needed climbing 3-5 steps with a railing? : Total 6 Click Score: 6    End of Session Equipment Utilized During Treatment: Oxygen;Gait belt Activity Tolerance: Patient tolerated treatment well Patient left: in bed;with call bell/phone within reach;with nursing/sitter in room Nurse Communication: Mobility status;Need for lift  equipment;Precautions;Other (comment) (tilt bed) PT Visit Diagnosis: Muscle weakness (generalized) (M62.81);Difficulty in walking, not elsewhere classified (R26.2);Other symptoms and signs involving the nervous system (R29.898);Other abnormalities of gait and mobility (R26.89)     Time: 7829-5621 PT Time Calculation (min) (ACUTE ONLY): 49 min  Charges:    $Therapeutic Activity: 8-22 mins PT General Charges $$ ACUTE PT VISIT: 1 Visit                     Lyanne Co, PT  Acute Rehab Services Secure chat preferred Office [phone]    Benetta Spar L Aalijah Mims 05/14/2023, 10:30 AM

## 2023-05-14 NOTE — Progress Notes (Signed)
Occupational Therapy Treatment Patient Details Name: Glenn Henderson MRN: [id number] DOB: [birth year] Today's Date: 05/14/2023   History of present illness 57 y.o. male admitted 12/16 after cardiac arrest with collapse outside of [employer]; 2 rounds of CPR with ROSC. CT with type II odontoid fx. 12/17 EEG with severe diffuse encephalopathy. 12/19 odontoid screw fixation. 12/21 extubated. PMHx: DMII, OSA, TBI with L weakness, DVT and PE on anticoagulation, hepatitis, peripheral neuropathy, seizures.   OT comments  Pt progressing toward established OT goals. Supine on arrival asking for pain medication and believes he is in a hitel and that he is in his 50s. Pt redirected and needing frequent redirection away from topic of pain meds as he becomes slightly agitated asking for them despite already having had them. Pt requiring total A +2 for bed mobility this session. Pt able to move RUE but not able to sustain shoulder flexion against gravity for longer than a few seconds. No activation of LUE. Provided PROM/AAROM to BUE this session at EOB. Worked on sititng balance and shifting weight forward. Max progressing to min A for sititng balance statically. Recommending intensive multidisciplinary rehabilitation >3 hours/day to optimize safety and independence in ADL.        If plan is discharge home, recommend the following:  Two people to help with walking and/or transfers;Two people to help with bathing/dressing/bathroom;Assist for transportation;Help with stairs or ramp for entrance;Direct supervision/assist for financial management;Direct supervision/assist for medications management;Assistance with cooking/housework;Assistance with feeding;Supervision due to cognitive status   Equipment Recommendations  Other (comment) (defer)    Recommendations for Other Services Rehab consult    Precautions / Restrictions Precautions Precautions: Cervical;Fall;Other (comment) Precaution Booklet Issued:  No Precaution Comments: watch vitals. LUE paresis Required Braces or Orthoses: Cervical Brace Cervical Brace: Soft collar;At all times (Spoke with Dr. Danielle Dess and asked if he thought pt to benefit from hard collar and he stated not needed 12/24) Restrictions Weight Bearing Restrictions Per Provider Order: No       Mobility Bed Mobility Overal bed mobility: Needs Assistance Bed Mobility: Rolling, Supine to Sit, Sit to Supine Rolling: Total assist, +2 for physical assistance Sidelying to sit: Total assist, +2 for physical assistance, +2 for safety/equipment, HOB elevated   Sit to supine: Total assist, +2 for physical assistance   General bed mobility comments: assisted pt in rolling to L to be able to use R side, unable to grasp and hold rail with R hand and needed assist bridging R knee.Tot A for LE's off EOB and elevation of trunk into sititng. Pt unable to scoot hips to EOB and can move RUE but not using in a meaningful way. Pt not using LUE at all but is able to hold it in a static position at times    Transfers Overall transfer level: Needs assistance   Transfers: Sit to/from Stand Sit to Stand: Mod assist, +2 safety/equipment           General transfer comment: Pt able to attempt to rise from surface with quad contraction of bil legs and both knees blocked x 4 attempts to rise with pt able to achieve partial rise with mod assist and trunk control via RUE support on RW and therapist supporting trunk from elevated surface. Initiates fwd wt shift but cannot fully extend through hips or knees     Balance Overall balance assessment: Needs assistance Sitting-balance support: Single extremity supported, Feet supported, Feet unsupported Sitting balance-Leahy Scale: Poor Sitting balance - Comments: R  and posterior lean in  sitting. Mod A most of the time with min A at times Postural control: Posterior lean, Right lateral lean Standing balance support: Bilateral upper extremity  supported, During functional activity Standing balance-Leahy Scale: Zero Standing balance comment: unable to achieve full standing               High Level Balance Comments: worked on reaching activities with RUE in sitting. ALso worked on pt using IS in sitting, needed assist to hold with R hand. Pt unable to move blue accordion. Then worked on making sucking position with lips and pretending to suck through straw. Breathing sounds better in sitting than supine           ADL either performed or assessed with clinical judgement   ADL Overall ADL's : Needs assistance/impaired                                       General ADL Comments: focused on ther act    Extremity/Trunk Assessment Upper Extremity Assessment Upper Extremity Assessment: RUE deficits/detail;LUE deficits/detail RUE Deficits / Details: decreased initiation needing increased time. Able to sustain grasp on therapist hand at level of heart for ~30 seconds with 4-/5 strength, but poor functional use to hold IS or RW; weak; increased effort to perform shoulder flexion to 90. able to reach across body to opposite shoulder just below level of L clavicle LUE Deficits / Details: predominantly flaccid, sublux noted. questionable trace activation of shoulder extension   Lower Extremity Assessment Lower Extremity Assessment: Defer to PT evaluation        Vision   Additional Comments: pt did not report   Perception     Praxis      Cognition Arousal: Alert Behavior During Therapy: Flat affect Overall Cognitive Status: Impaired/Different from baseline Area of Impairment: Attention, Following commands, Awareness, Safety/judgement, Problem solving, Memory, Orientation                 Orientation Level: Disoriented to, Place, Time, Situation Current Attention Level: Focused Memory: Decreased short-term memory Following Commands: Follows one step commands inconsistently, Follows one step commands  with increased time Safety/Judgement: Decreased awareness of safety, Decreased awareness of deficits Awareness: Intellectual Problem Solving: Slow processing, Decreased initiation, Difficulty sequencing, Requires verbal cues, Requires tactile cues General Comments: pt thinks he's in a hotel. Thinks he's turning a year in his 48's on his birthday this week. Thinks he left the hospital yesterday. Keeps asking for pain meds even after RN told him when he got his meds and when he can get them again. Gets mildly agitated at times about this        Exercises Exercises: General Lower Extremity General Exercises - Upper Extremity Shoulder Flexion:  (AAROM on RUE, PROM on LUE) General Exercises - Lower Extremity Ankle Circles/Pumps: AROM, Both, 10 reps, Supine Quad Sets: AAROM, Left, 5 reps, Supine Long Arc Quad: Both, AROM, 5 reps, Seated    Shoulder Instructions       General Comments HR up to 99 bpm. O2 sats stable on 1L.    Pertinent Vitals/ Pain       Pain Assessment Pain Assessment: 0-10 Pain Score: 10-Worst pain ever Pain Location: neck, shoulders Pain Descriptors / Indicators: Aching, Constant, Sore Pain Intervention(s): Patient requesting pain meds-RN notified, Other (comment) (RN in to give tylenow at end of session, breathing techniques)  Home Living  Prior Functioning/Environment              Frequency  Min 1X/week        Progress Toward Goals  OT Goals(current goals can now be found in the care plan section)  Progress towards OT goals: Progressing toward goals  Acute Rehab OT Goals Patient Stated Goal: decrease pain OT Goal Formulation: With patient Time For Goal Achievement: 05/25/23 Potential to Achieve Goals: Good ADL Goals Pt Will Perform Grooming: with contact guard assist;sitting Pt Will Perform Upper Body Bathing: sitting;with min assist Pt Will Perform Lower Body Bathing: with mod  assist;sit to/from stand Pt Will Transfer to Toilet: with max assist;with +2 assist Pt/caregiver will Perform Home Exercise Program: Left upper extremity  Plan      Co-evaluation      Reason for Co-Treatment: Complexity of the patient's impairments (multi-system involvement);Necessary to address cognition/behavior during functional activity;For patient/therapist safety;To address functional/ADL transfers PT goals addressed during session: Mobility/safety with mobility;Balance OT goals addressed during session: Strengthening/ROM      AM-PAC OT "6 Clicks" Daily Activity     Outcome Measure   Help from another person eating meals?: Total Help from another person taking care of personal grooming?: A Lot Help from another person toileting, which includes using toliet, bedpan, or urinal?: Total Help from another person bathing (including washing, rinsing, drying)?: Total Help from another person to put on and taking off regular upper body clothing?: A Lot Help from another person to put on and taking off regular lower body clothing?: Total 6 Click Score: 8    End of Session    OT Visit Diagnosis: Unsteadiness on feet (R26.81);Muscle weakness (generalized) (M62.81);Other symptoms and signs involving cognitive function;History of falling (Z91.81);Pain Pain - part of body:  (neck, bil shoulders)   Activity Tolerance     Patient Left     Nurse Communication          Time: 1610-9604 OT Time Calculation (min): 49 min  Charges: OT General Charges $OT Visit: 1 Visit OT Treatments $Therapeutic Activity: 23-37 mins  Tyler Deis, OTR/L [facility] Office: [phone]   Myrla Halsted 05/14/2023, 10:38 AM

## 2023-05-14 NOTE — Progress Notes (Signed)
ABG sample collected and sent to Lab. Lab called and notified.

## 2023-05-14 NOTE — Progress Notes (Signed)
Patient ID: Glenn Henderson, male   DOB: 19-Feb-1966, 57 y.o.   MRN: [id number] Signs are stable Patient's left upper extremity moves minimally spontaneously but not to command.  His lower extremities he can move to command slightly.  Right moves better than his left.  His right upper extremity demonstrates 2-3 out of 5 strength.  Being mobilized at the current time.

## 2023-05-15 DIAGNOSIS — F05 Delirium due to known physiological condition: Secondary | ICD-10-CM | POA: Diagnosis not present

## 2023-05-15 DIAGNOSIS — I469 Cardiac arrest, cause unspecified: Secondary | ICD-10-CM | POA: Diagnosis not present

## 2023-05-15 DIAGNOSIS — I10 Essential (primary) hypertension: Secondary | ICD-10-CM | POA: Diagnosis not present

## 2023-05-15 DIAGNOSIS — S022XXA Fracture of nasal bones, initial encounter for closed fracture: Secondary | ICD-10-CM | POA: Diagnosis not present

## 2023-05-15 NOTE — Plan of Care (Signed)
  Problem: Education: Goal: Ability to describe self-care measures that may prevent or decrease complications (Diabetes Survival Skills Education) will improve Outcome: Progressing   Problem: Coping: Goal: Ability to adjust to condition or change in health will improve Outcome: Progressing   Problem: Fluid Volume: Goal: Ability to maintain a balanced intake and output will improve Outcome: Progressing   Problem: Health Behavior/Discharge Planning: Goal: Ability to identify and utilize available resources and services will improve Outcome: Progressing Goal: Ability to manage health-related needs will improve Outcome: Progressing   Problem: Metabolic: Goal: Ability to maintain appropriate glucose levels will improve Outcome: Progressing   Problem: Nutritional: Goal: Maintenance of adequate nutrition will improve Outcome: Progressing Goal: Progress toward achieving an optimal weight will improve Outcome: Progressing   Problem: Skin Integrity: Goal: Risk for impaired skin integrity will decrease Outcome: Progressing   Problem: Tissue Perfusion: Goal: Adequacy of tissue perfusion will improve Outcome: Progressing   Problem: Education: Goal: Ability to manage disease process will improve Outcome: Progressing   Problem: Cardiac: Goal: Ability to achieve and maintain adequate cardiopulmonary perfusion will improve Outcome: Progressing   Problem: Neurologic: Goal: Promote progressive neurologic recovery Outcome: Progressing   Problem: Skin Integrity: Goal: Risk for impaired skin integrity will be minimized. Outcome: Progressing   Problem: Education: Goal: Knowledge of General Education information will improve Description: Including pain rating scale, medication(s)/side effects and non-pharmacologic comfort measures Outcome: Progressing   Problem: Health Behavior/Discharge Planning: Goal: Ability to manage health-related needs will improve Outcome: Progressing    Problem: Clinical Measurements: Goal: Ability to maintain clinical measurements within normal limits will improve Outcome: Progressing Goal: Will remain free from infection Outcome: Progressing Goal: Diagnostic test results will improve Outcome: Progressing Goal: Respiratory complications will improve Outcome: Progressing Goal: Cardiovascular complication will be avoided Outcome: Progressing   Problem: Activity: Goal: Risk for activity intolerance will decrease Outcome: Progressing   Problem: Nutrition: Goal: Adequate nutrition will be maintained Outcome: Progressing   Problem: Coping: Goal: Level of anxiety will decrease Outcome: Progressing   Problem: Elimination: Goal: Will not experience complications related to bowel motility Outcome: Progressing Goal: Will not experience complications related to urinary retention Outcome: Progressing   Problem: Pain Management: Goal: General experience of comfort will improve Outcome: Progressing   Problem: Safety: Goal: Ability to remain free from injury will improve Outcome: Progressing   Problem: Skin Integrity: Goal: Risk for impaired skin integrity will decrease Outcome: Progressing
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
## 2023-05-15 NOTE — Progress Notes (Signed)
PROGRESS NOTE    Glenn Henderson  RJJ:[id number] DOB: 1966/02/19 DOA: 05/06/2023 PCP: Fleet Contras, MD   Brief Narrative: Glenn Henderson is a 57 y.o. male with a history of hyperlipidemia, OSA, hepatitis, major depressive disorder, anxiety, PTSD, tobacco use, diabetes mellitus type 2, peripheral neuropathy, chronic radicular lumbar pain with chronic pain syndrome.  Patient presented secondary to out of hospital PEA arrest requiring 2 rounds of CPR prior to ROSC.  Patient intubated and admitted to the ICU for management of respiratory failure.  Patient also suffered a cervical spine fracture involving C1 requiring odontoid screw fixation by neurosurgery.  Hospitalization further complicated by multifocal pneumonia, pain management, delirium, suicidal ideation.   Assessment and Plan:  S/p PEA cardiac arrest Out-of-hospital event. Unclear etiology. EEG without evidence of seizures. Transthoracic Echocardiogram obtained and was significant for normal RV, LVEF of 60-65% and no regional wall motion abnormality.  Acute respiratory failure with hypoxia Secondary to cardiac arrest. Patient required mechanical ventilation from 12/16 until 12/21 when he was extubated successfully.  Multifocal pneumonia CT chest significant for bilateral lower lobe infiltrates consistent with pneumonia with culture positive for pseudomonas and now positive for enterobacter cloacae. -Continue Cefepime IV  Low TSH Unclear of the etiology. Free T4 is elevated with T3 still pending. Patient started on metoprolol for possible cardiac effects from thyroid illness. -Follow-up T3 -Continue T3 -Will likely need endocrinology follow-up as an outpatient  Delirium New. Possibly related to psych-related medications. Also with elevated temperature of unknown etiology. Mental status significantly improved today. -Delirium precautions  Unstable C-spine C1-C2 fracture Patient evaluated by neurosurgery with odontoid screw fixation  performed on 12/19 with subsequent need for replacement of odontoid screw. Recommendation for soft cervical collar. Plan for inpatient rehab once medically stable for discharge.  Nasal bone fractures Noted incidentally on CT imaging. Secondary to fall with head trauma. ENT with recommendations for no intervention.  Suicidal ideation Patient expressed suicidal ideation on 12/22 -Psychiatry recommendations: IVC, Effexor, Seroquel. New recommendations pending today  History of CVA History of TBI Patient with residual left-sided deficits with hemiparesis.  History of DVT/PE Patient is on Eliquis as an outpatient which was held on admission. Neurosurgery cleared patient for resumption of DVT prophylaxis but will need to clarify if able to restart full dose anticoagulation.  Elevated AST/ALT Unclear etiology. No obvious symptoms. Patient does have an elevated temperature, but no pain located in RUQ. [place] this admission significant for evidence of hepatic steatosis and hepatocellular disease. Lipitor held. CK normal. Numbers starting to improve.  Diabetes mellitus type 2 Uncontrolled with hyperglycemia. Hemoglobin A1C of 10%. -Continue Semglee and SSI  Peripheral neuropathy Patient is on gabapentin and Cymbalta as an outpatient. -Cymbalta held by psychiatry -Continue gabapentin  Anxiety Depression -Continue Klonopin  Chronic pain Noted. Patient reports being prescribed oxycodone by Dr. Tollie Eth in [place], [place]. Per PDMP review, patient has not been filling reported prescription of oxycodone 30 mg 6 times daily. Unclear of validity. Oxycodone increased to 10 mg q 4hours -Change to oxycodone 5-10 mg q4 hours as needed  Primary hypertension -Continue clonidine BID  Dysphagia Patient failed swallow study postextubation. Speech therapy reevaluated on 12/23 with continued recommendations for NPO. Will need to continue NG tube for nutrition until dysphagia improves. -Continue to follow SLP  recommendations    DVT prophylaxis: Lovenox Code Status:   Code Status: Full Code Family Communication: None at bedside Disposition Plan: Discharge to acute inpatient rehabilitation pending improved vitals/stability and pain tolerance   Consultants:  PCCM Neurosurgery  General surgery ENT Psychiatry  Procedures:    Antimicrobials:     Subjective: Patient reports pain, but otherwise is well.  Objective: BP (!) 155/85 (BP Location: Left Arm)   Pulse (!) 101   Temp 98.7 F (37.1 C) (Oral)   Resp (!) 30   Ht 6\' 2"  (1.88 m)   Wt 97.9 kg   SpO2 97%   BMI 27.71 kg/m   Examination:  General exam: Appears calm and comfortable Respiratory system: Clear to auscultation. Respiratory effort normal. Cardiovascular system: S1 & S2 heard, RRR. No murmurs. Gastrointestinal system: Abdomen is nondistended, soft and nontender. Normal bowel sounds heard. Central nervous system: Alert and oriented.   Data Reviewed: I have personally reviewed following labs and imaging studies  CBC Lab Results  Component Value Date   WBC 12.1 (H) 05/14/2023   RBC 4.21 (L) 05/14/2023   HGB 11.9 (L) 05/14/2023   HCT 36.7 (L) 05/14/2023   MCV 87.2 05/14/2023   MCH 28.3 05/14/2023   PLT 401 (H) 05/14/2023   MCHC 32.4 05/14/2023   RDW 14.2 05/14/2023   LYMPHSABS 2.3 05/14/2023   MONOABS 1.9 (H) 05/14/2023   EOSABS 0.4 05/14/2023   BASOSABS 0.2 (H) 05/14/2023     Last metabolic panel Lab Results  Component Value Date   NA 138 05/15/2023   K 3.7 05/15/2023   CL 106 05/15/2023   CO2 25 05/15/2023   BUN 14 05/15/2023   CREATININE 0.86 05/15/2023   GLUCOSE 251 (H) 05/15/2023   GFRNONAA >60 05/15/2023   CALCIUM 8.9 05/15/2023   PHOS 2.7 05/13/2023   PROT 7.1 05/15/2023   ALBUMIN 2.5 (L) 05/15/2023   BILITOT 0.6 05/15/2023   ALKPHOS 102 05/15/2023   AST 46 (H) 05/15/2023   ALT 113 (H) 05/15/2023   ANIONGAP 7 05/15/2023    GFR: Estimated Creatinine Clearance: 112.8 mL/min (by  C-G formula based on SCr of 0.86 mg/dL).  Recent Results (from the past 240 hours)  MRSA Next Gen by PCR, Nasal     Status: None   Collection Time: 05/06/23  8:12 PM   Specimen: Nasal Mucosa; Nasal Swab  Result Value Ref Range Status   MRSA by PCR Next Gen NOT DETECTED NOT DETECTED Final    Comment: (NOTE) The GeneXpert MRSA Assay (FDA approved for NASAL specimens only), is one component of a comprehensive MRSA colonization surveillance program. It is not intended to diagnose MRSA infection nor to guide or monitor treatment for MRSA infections. Test performance is not FDA approved in patients less than 58 years old. Performed at [facility], [address], [place], [place] [address]   Culture, Respiratory w Gram Stain     Status: None   Collection Time: 05/10/23 12:32 PM   Specimen: Tracheal Aspirate; Respiratory  Result Value Ref Range Status   Specimen Description TRACHEAL ASPIRATE  Final   Special Requests NONE  Final   Gram Stain   Final    ABUNDANT WBC PRESENT, PREDOMINANTLY PMN RARE GRAM NEGATIVE RODS RARE BUDDING YEAST SEEN Performed at [facility], [address], [place], [place] [address]    Culture   Final    ABUNDANT ENTEROBACTER CLOACAE RARE PSEUDOMONAS AERUGINOSA    Report Status 05/13/2023 FINAL  Final   Organism ID, Bacteria ENTEROBACTER CLOACAE  Final   Organism ID, Bacteria PSEUDOMONAS AERUGINOSA  Final      Susceptibility   Enterobacter cloacae - MIC*    CEFEPIME <=0.12 SENSITIVE Sensitive  CEFTAZIDIME <=1 SENSITIVE Sensitive     CIPROFLOXACIN <=0.25 SENSITIVE Sensitive     GENTAMICIN <=1 SENSITIVE Sensitive     IMIPENEM <=0.25 SENSITIVE Sensitive     TRIMETH/SULFA <=20 SENSITIVE Sensitive     PIP/TAZO <=4 SENSITIVE Sensitive ug/mL    * ABUNDANT ENTEROBACTER CLOACAE   Pseudomonas aeruginosa - MIC*    CEFTAZIDIME 4 SENSITIVE Sensitive     CIPROFLOXACIN 0.5 SENSITIVE Sensitive     GENTAMICIN <=1 SENSITIVE Sensitive     IMIPENEM 1  SENSITIVE Sensitive     CEFEPIME 4 SENSITIVE Sensitive     * RARE PSEUDOMONAS AERUGINOSA      Radiology Studies: No results found.     LOS: 9 days    Jacquelin Hawking, MD [facility] 05/15/2023, 9:30 AM   If 7PM-7AM, please contact night-coverage [email or web address]

## 2023-05-16 DIAGNOSIS — I469 Cardiac arrest, cause unspecified: Secondary | ICD-10-CM | POA: Diagnosis not present

## 2023-05-16 DIAGNOSIS — I10 Essential (primary) hypertension: Secondary | ICD-10-CM | POA: Diagnosis not present

## 2023-05-16 DIAGNOSIS — F05 Delirium due to known physiological condition: Secondary | ICD-10-CM | POA: Diagnosis not present

## 2023-05-16 DIAGNOSIS — S022XXA Fracture of nasal bones, initial encounter for closed fracture: Secondary | ICD-10-CM | POA: Diagnosis not present

## 2023-05-16 NOTE — Plan of Care (Signed)
  Problem: Fluid Volume: Goal: Ability to maintain a balanced intake and output will improve Outcome: Progressing   Problem: Metabolic: Goal: Ability to maintain appropriate glucose levels will improve Outcome: Progressing   Problem: Nutritional: Goal: Maintenance of adequate nutrition will improve Outcome: Progressing   

## 2023-05-16 NOTE — Progress Notes (Signed)
PROGRESS NOTE    Glenn Henderson  XBM:[id number] DOB: 02/02/1966 DOA: 05/06/2023 PCP: Fleet Contras, MD   Brief Narrative: Glenn Henderson is a 57 y.o. male with a history of hyperlipidemia, OSA, hepatitis, major depressive disorder, anxiety, PTSD, tobacco use, diabetes mellitus type 2, peripheral neuropathy, chronic radicular lumbar pain with chronic pain syndrome.  Patient presented secondary to out of hospital PEA arrest requiring 2 rounds of CPR prior to ROSC.  Patient intubated and admitted to the ICU for management of respiratory failure.  Patient also suffered a cervical spine fracture involving C1 requiring odontoid screw fixation by neurosurgery.  Hospitalization further complicated by multifocal pneumonia, pain management, delirium, suicidal ideation.   Assessment and Plan:  S/p PEA cardiac arrest Out-of-hospital event. Unclear etiology. EEG without evidence of seizures. Transthoracic Echocardiogram obtained and was significant for normal RV, LVEF of 60-65% and no regional wall motion abnormality.  Acute respiratory failure with hypoxia Secondary to cardiac arrest. Patient required mechanical ventilation from 12/16 until 12/21 when he was extubated successfully.  Multifocal pneumonia CT chest significant for bilateral lower lobe infiltrates consistent with pneumonia with culture positive for pseudomonas and now positive for enterobacter cloacae. -Continue and complete a 7-day course of Cefepime IV, not including prior antibiotic treatment  Low TSH Thyroid levels consistent with hyperthyroidism with low TSH, elevated free T4. Total T3 is normal, however. Patient started on metoprolol for cardiac effects. Overall, patient appears to be more stable after starting metoprolol. -Will likely need endocrinology follow-up as an outpatient -Continue metoprolol  Delirium New. Possibly related to psych-related medications. Also with elevated temperature of unknown etiology. Mental status  significantly improved today. -Delirium precautions  Unstable C-spine C1-C2 fracture Patient evaluated by neurosurgery with odontoid screw fixation performed on 12/19 with subsequent need for replacement of odontoid screw. Recommendation for soft cervical collar. Plan for inpatient rehab once medically stable for discharge.  Nasal bone fractures Noted incidentally on CT imaging. Secondary to fall with head trauma. ENT with recommendations for no intervention.  Suicidal ideation Patient expressed suicidal ideation on 12/22 -Psychiatry recommendations: IVC, Effexor, Seroquel, tapering Klonopin. New recommendations pending today  History of CVA History of TBI Patient with residual left-sided deficits with hemiparesis.  History of DVT/PE Patient is on Eliquis as an outpatient which was held on admission. Neurosurgery cleared patient for resumption of DVT prophylaxis but will need to clarify if able to restart full dose anticoagulation.  Elevated AST/ALT Unclear etiology. No obvious symptoms. Patient does have an elevated temperature, but no pain located in RUQ. [place] this admission significant for evidence of hepatic steatosis and hepatocellular disease. Lipitor held. CK normal. Numbers starting to improve.  Diabetes mellitus type 2 Uncontrolled with hyperglycemia. Hemoglobin A1C of 10%. -Continue Semglee and SSI  Peripheral neuropathy Patient is on gabapentin and Cymbalta as an outpatient. -Cymbalta held by psychiatry -Continue gabapentin  Anxiety Depression -Continue Klonopin  Chronic pain Noted. Patient reports being prescribed oxycodone by Dr. Tollie Eth in [place], [place]. Per PDMP review, patient has not been filling reported prescription of oxycodone 30 mg 6 times daily. Unclear of validity. Oxycodone increased to 10 mg q 4hours -Continue increased dose of oxycodone 5-10 mg q4 hours as needed  Primary hypertension -Continue clonidine BID  Dysphagia Patient failed swallow study  postextubation. Speech therapy reevaluated on 12/23 with continued recommendations for NPO. Will need to continue NG tube for nutrition until dysphagia improves. -Continue to follow SLP recommendations    DVT prophylaxis: Lovenox Code Status:   Code Status: Full Code Family  Communication: None at bedside Disposition Plan: Discharge to acute inpatient rehabilitation pending improved vitals/stability and pain tolerance   Consultants:  PCCM Neurosurgery General surgery ENT Psychiatry  Procedures:    Antimicrobials:     Subjective: Patient reports no specific issues. States he got up with therapy yesterday. Wants to start eating. He also mentioned having gone home and came back to the hospital yesterday.  Objective: BP 132/79 (BP Location: Left Arm)   Pulse 94   Temp 98 F (36.7 C) (Axillary)   Resp 20   Ht 6\' 2"  (1.88 m)   Wt 126.6 kg   SpO2 96%   BMI 35.83 kg/m   Examination:  General exam: Appears calm and comfortable. Legs are hanging off the side of the bed. Respiratory system: Clear to auscultation. Respiratory effort normal. Cardiovascular system: S1 & S2 heard, Normal rate with regular rhythm Gastrointestinal system: Abdomen is nondistended, soft and nontender. Normal bowel sounds heard. Central nervous system: Alert and oriented.   Data Reviewed: I have personally reviewed following labs and imaging studies  CBC Lab Results  Component Value Date   WBC 12.1 (H) 05/14/2023   RBC 4.21 (L) 05/14/2023   HGB 11.9 (L) 05/14/2023   HCT 36.7 (L) 05/14/2023   MCV 87.2 05/14/2023   MCH 28.3 05/14/2023   PLT 401 (H) 05/14/2023   MCHC 32.4 05/14/2023   RDW 14.2 05/14/2023   LYMPHSABS 2.3 05/14/2023   MONOABS 1.9 (H) 05/14/2023   EOSABS 0.4 05/14/2023   BASOSABS 0.2 (H) 05/14/2023     Last metabolic panel Lab Results  Component Value Date   NA 138 05/15/2023   K 3.7 05/15/2023   CL 106 05/15/2023   CO2 25 05/15/2023   BUN 14 05/15/2023   CREATININE  0.86 05/15/2023   GLUCOSE 251 (H) 05/15/2023   GFRNONAA >60 05/15/2023   CALCIUM 8.9 05/15/2023   PHOS 2.7 05/13/2023   PROT 7.1 05/15/2023   ALBUMIN 2.5 (L) 05/15/2023   BILITOT 0.6 05/15/2023   ALKPHOS 102 05/15/2023   AST 46 (H) 05/15/2023   ALT 113 (H) 05/15/2023   ANIONGAP 7 05/15/2023    GFR: Estimated Creatinine Clearance: 137.3 mL/min (by C-G formula based on SCr of 0.86 mg/dL).  Recent Results (from the past 240 hours)  MRSA Next Gen by PCR, Nasal     Status: None   Collection Time: 05/06/23  8:12 PM   Specimen: Nasal Mucosa; Nasal Swab  Result Value Ref Range Status   MRSA by PCR Next Gen NOT DETECTED NOT DETECTED Final    Comment: (NOTE) The GeneXpert MRSA Assay (FDA approved for NASAL specimens only), is one component of a comprehensive MRSA colonization surveillance program. It is not intended to diagnose MRSA infection nor to guide or monitor treatment for MRSA infections. Test performance is not FDA approved in patients less than 51 years old. Performed at [facility], [address] [place], [place] [address]   Culture, Respiratory w Gram Stain     Status: None   Collection Time: 05/10/23 12:32 PM   Specimen: Tracheal Aspirate; Respiratory  Result Value Ref Range Status   Specimen Description TRACHEAL ASPIRATE  Final   Special Requests NONE  Final   Gram Stain   Final    ABUNDANT WBC PRESENT, PREDOMINANTLY PMN RARE GRAM NEGATIVE RODS RARE BUDDING YEAST SEEN Performed at [facility], [address], [place], [place] [address]    Culture   Final    ABUNDANT ENTEROBACTER CLOACAE  RARE PSEUDOMONAS AERUGINOSA    Report Status 05/13/2023 FINAL  Final   Organism ID, Bacteria ENTEROBACTER CLOACAE  Final   Organism ID, Bacteria PSEUDOMONAS AERUGINOSA  Final      Susceptibility   Enterobacter cloacae - MIC*    CEFEPIME <=0.12 SENSITIVE Sensitive     CEFTAZIDIME <=1 SENSITIVE Sensitive     CIPROFLOXACIN <=0.25 SENSITIVE Sensitive      GENTAMICIN <=1 SENSITIVE Sensitive     IMIPENEM <=0.25 SENSITIVE Sensitive     TRIMETH/SULFA <=20 SENSITIVE Sensitive     PIP/TAZO <=4 SENSITIVE Sensitive ug/mL    * ABUNDANT ENTEROBACTER CLOACAE   Pseudomonas aeruginosa - MIC*    CEFTAZIDIME 4 SENSITIVE Sensitive     CIPROFLOXACIN 0.5 SENSITIVE Sensitive     GENTAMICIN <=1 SENSITIVE Sensitive     IMIPENEM 1 SENSITIVE Sensitive     CEFEPIME 4 SENSITIVE Sensitive     * RARE PSEUDOMONAS AERUGINOSA      Radiology Studies: No results found.     LOS: 10 days    Jacquelin Hawking, MD [facility] 05/16/2023, 9:23 AM   If 7PM-7AM, please contact night-coverage [email or web address]

## 2023-05-16 NOTE — Plan of Care (Signed)
  Problem: Education: Goal: Ability to describe self-care measures that may prevent or decrease complications (Diabetes Survival Skills Education) will improve Outcome: Progressing   Problem: Coping: Goal: Ability to adjust to condition or change in health will improve Outcome: Progressing   Problem: Fluid Volume: Goal: Ability to maintain a balanced intake and output will improve Outcome: Progressing   Problem: Health Behavior/Discharge Planning: Goal: Ability to identify and utilize available resources and services will improve Outcome: Progressing Goal: Ability to manage health-related needs will improve Outcome: Progressing   Problem: Metabolic: Goal: Ability to maintain appropriate glucose levels will improve Outcome: Progressing   Problem: Nutritional: Goal: Maintenance of adequate nutrition will improve Outcome: Progressing Goal: Progress toward achieving an optimal weight will improve Outcome: Progressing   Problem: Skin Integrity: Goal: Risk for impaired skin integrity will decrease Outcome: Progressing   Problem: Tissue Perfusion: Goal: Adequacy of tissue perfusion will improve Outcome: Progressing   Problem: Education: Goal: Ability to manage disease process will improve Outcome: Progressing   Problem: Cardiac: Goal: Ability to achieve and maintain adequate cardiopulmonary perfusion will improve Outcome: Progressing   Problem: Neurologic: Goal: Promote progressive neurologic recovery Outcome: Progressing   Problem: Skin Integrity: Goal: Risk for impaired skin integrity will be minimized. Outcome: Progressing   Problem: Education: Goal: Knowledge of General Education information will improve Description: Including pain rating scale, medication(s)/side effects and non-pharmacologic comfort measures Outcome: Progressing   Problem: Health Behavior/Discharge Planning: Goal: Ability to manage health-related needs will improve Outcome: Progressing    Problem: Clinical Measurements: Goal: Ability to maintain clinical measurements within normal limits will improve Outcome: Progressing Goal: Will remain free from infection Outcome: Progressing Goal: Diagnostic test results will improve Outcome: Progressing Goal: Respiratory complications will improve Outcome: Progressing Goal: Cardiovascular complication will be avoided Outcome: Progressing   Problem: Activity: Goal: Risk for activity intolerance will decrease Outcome: Progressing   Problem: Nutrition: Goal: Adequate nutrition will be maintained Outcome: Progressing   Problem: Coping: Goal: Level of anxiety will decrease Outcome: Progressing   Problem: Elimination: Goal: Will not experience complications related to bowel motility Outcome: Progressing Goal: Will not experience complications related to urinary retention Outcome: Progressing   Problem: Pain Management: Goal: General experience of comfort will improve Outcome: Progressing   Problem: Safety: Goal: Ability to remain free from injury will improve Outcome: Progressing   Problem: Skin Integrity: Goal: Risk for impaired skin integrity will decrease Outcome: Progressing

## 2023-05-16 NOTE — Progress Notes (Signed)
Speech Language Pathology Treatment: Dysphagia  Patient Details Name: Glenn Henderson MRN: [id number] DOB: July 10, 1965 Today's Date: 05/16/2023 Time: 7829-5621 SLP Time Calculation (min) (ACUTE ONLY): 18 min  Assessment / Plan / Recommendation Clinical Impression  Pt with strong voice and cough today.  His clinical symptoms of dysphagia appear to be diminishing, although still present with ongoing concerns for both impaired airway protection and residue. Demonstrates throat clearing, mild cough, and multiple sub-swallows with ice chips/sips of water.  He will ready for instrumental swallow study in the next 24-48 hours. Continue to offer ice chips after oral care; otherwise NPO and TF.    HPI HPI: Pt is a 57 y.o. 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 CT with type II odontoid fx; 12/19 underwent anterior screw fixation with fluoroscopic guidance and replacement of odontoid screw. Bilateral nasal bone fxs. EEG with severe diffuse encephalopathy. ETT 12/16-21. 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 MR cervical spine 12/18, relevant to swallowing is small prevertebral effusion extending from C1-C5, measuring up to 8 mm.      SLP Plan  Continue with current plan of care      Recommendations for follow up therapy are one component of a multi-disciplinary discharge planning process, led by the attending physician.  Recommendations may be updated based on patient status, additional functional criteria and insurance authorization.    Recommendations  Diet recommendations: NPO (ice chips after oral care) Medication Administration: Via alternative means                  Oral care prior to ice chip/H20   Frequent or constant Supervision/Assistance Dysphagia, pharyngeal phase  (R13.13)     Continue with current plan of care    Glenn Henderson L. Samson Frederic, MA CCC/SLP Clinical Specialist - Acute Care SLP Acute Rehabilitation Services Office number [phone]  [name]  05/16/2023, 1:32 PM
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
## 2023-05-16 NOTE — Progress Notes (Addendum)
Inpatient Rehab Admissions Coordinator:   Left another message for pt's mother to discuss caregiver support.  Will likely need a significant amount of physical assist at discharge given current documentation on mobility/adls.    13:09: Spoke to pt's mom regarding PLOF and caregiver support.  Pt lives with his s/o and his son at baseline, has an aid 7 hrs/day 7 days/week.  He is typically independent/mod I for limited community mobility.  She is able to come help at discharge some in the evenings as well.  We reviewed current level of function, and it unclear whether his cognition may be playing a role in his significant need for assist.  I would like to see how he does with therapy over the weekend and I wlil f/u with her on Monday to see if we can get a better idea of his expected level of assist needed after a rehab stay.   Estill Dooms, PT, DPT Admissions Coordinator [phone] 05/16/23  1:02 PM

## 2023-05-16 NOTE — TOC Progression Note (Signed)
Transition of Care [facility]) - Progression Note    Patient Details  Name: Sephiroth Snell MRN: [id number] Date of Birth: 25-Aug-1965  Transition of Care [facility]) CM/SW Contact  Erin Sons, [place] Phone Number: 05/16/2023, 12:40 PM  Clinical Narrative:     CSW submitted Notice of Commitment Change form into IVC Case#[id number].   Commitment Change envelope# is [id number]  IVC is rescinded.   Expected Discharge Plan:  (TBD)    Expected Discharge Plan and Services   Discharge Planning Services: CM Consult   Living arrangements for the past 2 months: Single Family Home                                       Social Determinants of Health (SDOH) Interventions SDOH Screenings   Food Insecurity: Patient Unable To Answer (05/07/2023)  Housing: Patient Unable To Answer (05/07/2023)  Transportation Needs: Patient Unable To Answer (05/07/2023)  Utilities: Patient Unable To Answer (05/07/2023)  Tobacco Use: High Risk (05/07/2023)    Readmission Risk Interventions     No data to display

## 2023-05-17 DIAGNOSIS — I469 Cardiac arrest, cause unspecified: Secondary | ICD-10-CM | POA: Diagnosis not present

## 2023-05-17 NOTE — Progress Notes (Signed)
Physical Therapy Treatment Patient Details Name: Glenn Henderson MRN: [id number] DOB: 05-22-1965 Today's Date: 05/17/2023   History of Present Illness 57 y.o. male admitted 12/16 after cardiac arrest with collapse outside of bar[employer] shop; 2 rounds of CPR with ROSC. CT with type II odontoid fx. 12/17 EEG with severe diffuse encephalopathy. 12/19 odontoid screw fixation. 12/21 extubated. PMHx: DMII, OSA, TBI with L weakness, DVT and PE on anticoagulation, hepatitis, peripheral neuropathy, seizures.    PT Comments  The pt is making great functional progress as he only required modAx2 for bed mobility and transfers sit to stand and step pivot with bil HHA this date. He continues to demonstrate L-sided weakness, but demonstrates good progress with L UE muscle activation and L leg strength. On 12/24, he was still flaccid in the L UE, but today pt demonstrated active movement of his entire UE when he concentrated hard on the cues provided. He was able to push his trunk up to midline from being propped on his L elbow and was able to flex his elbow with AAROM and flex his fingers to make a fist. Pt continues to demonstrate deficits in cognition (A&Ox4 though now), balance, activity tolerance, and gross strength (L weaker than R). He could greatly benefit from intensive inpatient rehab, > 3 hours/day, to maximize his return to baseline. Will continue to follow acutely.     If plan is discharge home, recommend the following: Two people to help with walking and/or transfers;Two people to help with bathing/dressing/bathroom;Assistance with cooking/housework;Direct supervision/assist for medications management;Direct supervision/assist for financial management;Assist for transportation;Help with stairs or ramp for entrance;Assistance with feeding   Can travel by private vehicle        Equipment Recommendations  Hospital bed;Hoyer lift;Wheelchair (measurements PT);Wheelchair cushion (measurements PT)     Recommendations for Other Services       Precautions / Restrictions Precautions Precautions: Cervical;Fall;Other (comment) Precaution Booklet Issued: No Precaution Comments: watch vitals; L hemi Required Braces or Orthoses: Cervical Brace Cervical Brace: Soft collar;At all times Restrictions Weight Bearing Restrictions Per Provider Order: No     Mobility  Bed Mobility Overal bed mobility: Needs Assistance Bed Mobility: Sidelying to Sit   Sidelying to sit: Mod assist, +2 for physical assistance, +2 for safety/equipment, HOB elevated       General bed mobility comments: Pt brought bil legs off L EOB as cued and then semi-rolled to L and pulled up on therapist's arm with his R UE to sit up L EOB, modAx2 needed at trunk.    Transfers Overall transfer level: Needs assistance Equipment used: Rolling walker (2 wheels), 2 person hand held assist Transfers: Sit to/from Stand, Bed to chair/wheelchair/BSC Sit to Stand: Mod assist, +2 safety/equipment, +2 physical assistance   Step pivot transfers: Mod assist, +2 physical assistance, +2 safety/equipment       General transfer comment: Pt stood from EOB to RW 1x then from EOB to bil HHA 2x. Pt required intermittent L knee blocking and verbal and tactile cues to extend his hips and knees to stand upright each rep. ModAx2 needed to power up to stand and gain balance. Heavy modAx2 provided for balance, shifting his weight to his L, advancing his R foot to the R, and blocking his L knee intermittently to step pivot to R from bed to recliner with bil HHA. Pt initiated steps with his L intermittently.    Ambulation/Gait Ambulation/Gait assistance: Mod assist, +2 physical assistance, +2 safety/equipment Gait Distance (Feet): 1 Feet Assistive device: 2 person hand  held assist Gait Pattern/deviations: Step-to pattern, Decreased step length - right, Decreased step length - left, Decreased stride length, Decreased weight shift to left, Knee  flexed in stance - left, Trunk flexed Gait velocity: reduced Gait velocity interpretation: <1.31 ft/sec, indicative of household ambulator   General Gait Details: Heavy modAx2 provided for balance, shifting his weight to his L, advancing his R foot to the R, and blocking his L knee intermittently to step pivot to R from bed to recliner with bil HHA. Pt initiated steps with his L intermittently.   Stairs             Wheelchair Mobility     Tilt Bed    Modified Rankin (Stroke Patients Only) Modified Rankin (Stroke Patients Only) Pre-Morbid Rankin Score: No significant disability Modified Rankin: Severe disability     Balance Overall balance assessment: Needs assistance Sitting-balance support: Single extremity supported, Feet supported, Bilateral upper extremity supported Sitting balance-Leahy Scale: Fair Sitting balance - Comments: Pt has a R lateral bias and intermittently needs minA for balance and cues to find and maintain midline. Intermittent CGA for safety only though. Propped pt on L elbow with minA for balance, x10 reps Postural control: Right lateral lean Standing balance support: Bilateral upper extremity supported, During functional activity Standing balance-Leahy Scale: Poor Standing balance comment: ModAx2 and bil HHA to stand, cuing pt to extend his trunk and legs                            Cognition Arousal: Alert Behavior During Therapy: WFL for tasks assessed/performed Overall Cognitive Status: Impaired/Different from baseline Area of Impairment: Attention, Following commands, Awareness, Safety/judgement, Problem solving                   Current Attention Level: Selective   Following Commands: Follows one step commands with increased time, Follows one step commands consistently, Follows multi-step commands with increased time, Follows multi-step commands inconsistently Safety/Judgement: Decreased awareness of safety, Decreased  awareness of deficits Awareness: Emergent Problem Solving: Slow processing, Difficulty sequencing, Requires verbal cues, Requires tactile cues General Comments: Pt A&Ox4. Pt joking during session. He needs cues to focus on his L extremities to activate the L muscles during mobility. Has difficulty following 2 or more step commands.        Exercises General Exercises - Upper Extremity Elbow Flexion: AAROM, Left, 5 reps, Seated Elbow Extension: AAROM, Left, 5 reps, Seated Digit Composite Flexion: AROM, Left, 5 reps, Seated Other Exercises Other Exercises: L UE "punching" forward x5 reps, AAROM, sitting EOB Other Exercises: Propping on L elbow (support provided at shoulder) and cuing pt to push through L hand to ascend trunk while sitting EOB, x10 reps, progressing from AAROM to AROM with noted triceps activation    General Comments General comments (skin integrity, edema, etc.): VSS on supplemental O2 via [place]      Pertinent Vitals/Pain Pain Assessment Pain Assessment: 0-10 Pain Score: 10-Worst pain ever Pain Location: neck, back, bil UEs Pain Descriptors / Indicators: Sore, Discomfort, Grimacing, Guarding Pain Intervention(s): Monitored during session, Limited activity within patient's tolerance, Premedicated before session, Repositioned, Patient requesting pain meds-RN notified    Home Living                          Prior Function            PT Goals (current goals can now be found in the  care plan section) Acute Rehab PT Goals Patient Stated Goal: to walk again PT Goal Formulation: With patient Time For Goal Achievement: 05/25/23 Potential to Achieve Goals: Good Progress towards PT goals: Progressing toward goals    Frequency    Min 1X/week      PT Plan      Co-evaluation              AM-PAC PT "6 Clicks" Mobility   Outcome Measure  Help needed turning from your back to your side while in a flat bed without using bedrails?: Total Help needed  moving from lying on your back to sitting on the side of a flat bed without using bedrails?: Total Help needed moving to and from a bed to a chair (including a wheelchair)?: Total Help needed standing up from a chair using your arms (e.g., wheelchair or bedside chair)?: Total Help needed to walk in hospital room?: Total Help needed climbing 3-5 steps with a railing? : Total 6 Click Score: 6    End of Session Equipment Utilized During Treatment: Oxygen;Gait belt Activity Tolerance: Patient tolerated treatment well Patient left: with call bell/phone within reach;in chair;with chair alarm set Nurse Communication: Mobility status;Need for lift equipment;Other (comment) (pt having difficulty feeding himself) PT Visit Diagnosis: Muscle weakness (generalized) (M62.81);Difficulty in walking, not elsewhere classified (R26.2);Other symptoms and signs involving the nervous system (R29.898);Other abnormalities of gait and mobility (R26.89);Unsteadiness on feet (R26.81)     Time: 1610-9604 PT Time Calculation (min) (ACUTE ONLY): 38 min  Charges:    $Therapeutic Exercise: 8-22 mins $Therapeutic Activity: 23-37 mins PT General Charges $$ ACUTE PT VISIT: 1 Visit                     Virgil Benedict, PT, DPT Acute Rehabilitation Services  Office: [phone]    Bettina Gavia 05/17/2023, 5:37 PM

## 2023-05-17 NOTE — Plan of Care (Signed)
  Problem: Education: Goal: Ability to describe self-care measures that may prevent or decrease complications (Diabetes Survival Skills Education) will improve Outcome: Progressing   Problem: Coping: Goal: Ability to adjust to condition or change in health will improve Outcome: Progressing   Problem: Fluid Volume: Goal: Ability to maintain a balanced intake and output will improve Outcome: Progressing   Problem: Health Behavior/Discharge Planning: Goal: Ability to identify and utilize available resources and services will improve Outcome: Progressing Goal: Ability to manage health-related needs will improve Outcome: Progressing   Problem: Metabolic: Goal: Ability to maintain appropriate glucose levels will improve Outcome: Progressing   Problem: Nutritional: Goal: Maintenance of adequate nutrition will improve Outcome: Progressing Goal: Progress toward achieving an optimal weight will improve Outcome: Progressing   Problem: Skin Integrity: Goal: Risk for impaired skin integrity will decrease Outcome: Progressing   Problem: Health Behavior/Discharge Planning: Goal: Ability to manage health-related needs will improve Outcome: Progressing   Problem: Clinical Measurements: Goal: Ability to maintain clinical measurements within normal limits will improve Outcome: Progressing Goal: Will remain free from infection Outcome: Progressing Goal: Diagnostic test results will improve Outcome: Progressing Goal: Respiratory complications will improve Outcome: Progressing   Problem: Activity: Goal: Risk for activity intolerance will decrease Outcome: Progressing   Problem: Nutrition: Goal: Adequate nutrition will be maintained Outcome: Progressing   Problem: Coping: Goal: Level of anxiety will decrease Outcome: Progressing   Problem: Elimination: Goal: Will not experience complications related to bowel motility Outcome: Progressing Goal: Will not experience complications  related to urinary retention Outcome: Progressing   Problem: Pain Management: Goal: General experience of comfort will improve Outcome: Progressing   Problem: Safety: Goal: Ability to remain free from injury will improve Outcome: Progressing   Problem: Skin Integrity: Goal: Risk for impaired skin integrity will decrease Outcome: Progressing

## 2023-05-17 NOTE — Progress Notes (Signed)
Speech Language Pathology Treatment: Dysphagia  Patient Details Name: Pappu Mcillwain MRN: [id number] DOB: Oct 10, 1965 Today's Date: 05/17/2023 Time: 4132-4401 SLP Time Calculation (min) (ACUTE ONLY): 13 min  Assessment / Plan / Recommendation Clinical Impression  Pt's secretion management has improved; respirations are clear at baseline and he was able to produce a strong cough. Pharyngeal dysphagia is suspected as he had immediate throat clears following teaspoon and cup sip trials of water. Suspect pharyngeal residue given sub-swallows. Feel his cervical edema has decreased and pt is ready for MBS and is scheduled for today at 1:30.   HPI HPI: Pt is a 57 y.o. 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 CT with type II odontoid fx; 12/19 underwent anterior screw fixation with fluoroscopic guidance and replacement of odontoid screw. Bilateral nasal bone fxs. EEG with severe diffuse encephalopathy. ETT 12/16-21. 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 MR cervical spine 12/18, relevant to swallowing is small prevertebral effusion extending from C1-C5, measuring up to 8 mm.      SLP Plan  MBS      Recommendations for follow up therapy are one component of a multi-disciplinary discharge planning process, led by the attending physician.  Recommendations may be updated based on patient status, additional functional criteria and insurance authorization.    Recommendations  Diet recommendations: Other(comment) (ice chips after oral care) Medication Administration: Via alternative means                  Oral care prior to ice chip/H20   Intermittent Supervision/Assistance Dysphagia, pharyngeal phase (R13.13)     MBS     Royce Macadamia  05/17/2023, 10:48 AM

## 2023-05-17 NOTE — Procedures (Signed)
Modified Barium Swallow Study  Patient Details  Name: Glenn Henderson MRN: [id number] Date of Birth: [birth year]  Today's Date: 05/17/2023  Modified Barium Swallow completed.  Full report located under Chart Review in the Imaging Section.  History of Present Illness Pt is a 57 y.o. 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 CT with type II odontoid fx; 12/19 underwent anterior screw fixation with fluoroscopic guidance and replacement of odontoid screw. Bilateral nasal bone fxs. EEG with severe diffuse encephalopathy. ETT 12/16-21. 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 MR cervical spine 12/18, relevant to swallowing is small prevertebral effusion extending from C1-C5, measuring up to 8 mm.   Clinical Impression Pt presents with a mild oropharyngeal dysphagia per results of MBSS completed today. View of trachea was partially obstructed by pt's shoulder position during the study. No aspiration observed across trials.   Oral deficits characterized by reduced oral strength and coordination resulting in posterior spillage of liquids to the valleculae before the swallow. There was  increased oral prep and transit time with piecemeal deglutition of nectar-thick liquids, pudding, and soft solid trials. There was trace-min oral residue following most trials, which cleared with spontaneous, subsequent swallow.   Pharyngeal deficits characterized by reduced base of tongue retraction, and reduced laryngeal vestibule closure, which resulted in transient, shallow penetration of consecutive sips of thin liquids by cup/straw. There was x1 instance of transient, shallow penetration of thin liquids by sing straw sip, otherwise, small sips were effective for eliminating penetration.   Detailed diet  recommendations and aspiration precautions outlined below.   Education: SLP reviewed MBSS findings and detailed diet recommendations with pt following the study. Used teach back method to review compensatory swallow strategies and aspiration precautions with pt. Good understanding verbalized with all questions pertaining to swallowing answered at this time.    Factors that may increase risk of adverse event in presence of aspiration [name] & [name] 2021): Frail or deconditioned;Presence of tubes (ETT, trach, NG, etc.)  Swallow Evaluation Recommendations Recommendations: PO diet PO Diet Recommendation: Dysphagia 3 (Mechanical soft);Thin liquids (Level 0) Liquid Administration via: Cup;Straw Medication Administration: Whole meds with puree Supervision: Staff to assist with self-feeding;Full supervision/cueing for swallowing strategies Swallowing strategies  : Slow rate;Small bites/sips;Multiple dry swallows after each bite/sip Postural changes: Position pt fully upright for meals Oral care recommendations: Oral care BID (2x/day)      Ellery Plunk 05/17/2023,3:31 PM

## 2023-05-17 NOTE — Progress Notes (Signed)
PROGRESS NOTE Glenn Henderson  WGN:[id number] DOB: 09/20/1965 DOA: 05/06/2023 PCP: Fleet Contras, MD  Brief Narrative/Hospital Course: Glenn Henderson is a 57 y.o. male with a history of hyperlipidemia, OSA, hepatitis, major depressive disorder, anxiety, PTSD, tobacco use, diabetes mellitus type 2, peripheral neuropathy, chronic radicular lumbar pain with chronic pain syndrome.  Patient presented secondary to out of hospital PEA arrest requiring 2 rounds of CPR prior to ROSC.  Patient intubated and admitted to the ICU for management of respiratory failure.  Patient also suffered a cervical spine fracture involving C1 requiring odontoid screw fixation by neurosurgery.  Hospitalization further complicated by multifocal pneumonia, pain management, delirium, suicidal ideation. At this point patient is for acute inpatient rehabilitation pending improvement in vitals and pain tolerance.     Subjective: Seen and exammined Overnight low-grade temp 100.1 BP stable doing well on nasal cannula Blood sugar in 140s this morning Last blood work from 12/24 with a stable BMP and leukocytosis Patient on Tylenol, Oxy 5-10 mg every 4 hours as needed, IV cefepime in place.  On Klonopin 0.25 3 times daily gabapentin 300 every 8 hours significant 50 twice daily thiamine venlafaxine clonidine as well Blood sugar relatively controlled on tube feeding avoid SSI 0-20 u, 4 u q4hr and 24 units twice daily seemeglee He is asking when can he is NGT can be removed, working with speech currently at bedside  Assessment and Plan: Principal Problem:   Cardiac arrest [facility]) Active Problems:   Closed fracture of nasal bones   Delirium due to another medical condition  S/p PEA cardiac arrest out of hospital: Unclear etiology. EEG without evidence of seizures. HQI:ONGEXB RV, LVEF of 60-65% and no regional wall motion abnormality.  Currently doing well, normal medically stable   Acute respiratory failure with hypoxia In the setting of  cardiac arrest needing mechanical ventilation until 12/21 and on 4l [place] now  Multifocal pneumonia CT chest>bilateral lower lobe infiltrates consistent with pneumonia with culture positive for pseudomonas and positive for enterobacter cloacae.  Continue cefepime.  Monitor temperature curve  Low TSH Thyroid levels consistent with hyperthyroidism with low TSH, elevated free T4. Total T3 is normal, however. Patient started on metoprolol for cardiac effects and appears to be overall stable.  Will add on to currently follow-up as outpatient   Delirium: Likely in the setting of multiple meds, prolonged hospitalization mentation improved,continue delirium precaution   Unstable C-spine C1-C2 fracture: Patient evaluated by neurosurgery with odontoid screw fixation performed on 12/19 with subsequent need for replacement of odontoid screw.  Continue soft cervical collar, plan for inpatient rehab once medically stable    Nasal bone fractures Noted incidentally on CT imaging. Secondary to fall with head trauma. ENT with recommendations for no intervention.   Suicidal ideation Patient expressed suicidal ideation on 12/22-seen by psychiatry:IVC, Effexor, Seroquel, tapering Klonopin.  Seen again 12/26 advised one-to-one until discharge however overall low acute risk for suicide and has consistently denied in the last 3 days.   History of CVA History of TBI Previous left-sided ablation  the same.   History of DVT/PE Patient is on Eliquis as an outpatient which was held on admission. Neurosurgery cleared patient for resumption of DVT prophylaxis but will need to clarify if able to restart full dose anticoagulation soon.   Elevated AST/ALT Unclear etiology. No obvious symptoms. Patient does have an elevated temperature, but no pain located in RUQ. [place] this admission significant for evidence of hepatic steatosis and hepatocellular disease. Lipitor held. CK normal.  Labs overall better  monitor intermittently   Recent Labs  Lab 05/11/23 0241 05/14/23 0805 05/15/23 0425  AST 47* 86* 46*  ALT 79* 150* 113*  ALKPHOS 137* 118 102  BILITOT 0.7 0.5 0.6  PROT 7.3 7.3 7.1  ALBUMIN 2.5* 2.6* 2.5*     Diabetes mellitus type 2 with uncontrolled hyperglycemia: A1c at 10, continue Semglee and SSI at current dose. Recent Labs  Lab 05/16/23 2009 05/17/23 0011 05/17/23 0449 05/17/23 0808 05/17/23 1250  GLUCAP 234* 102* 129* 142* 258*    Peripheral neuropathy Stable on home Neurontin.  Also on Cymbalta at home held by psychiatry   Anxiety Depression Mood stable continue Klonopin   Chronic pain  On oxycodone by Dr. Tollie Eth in [place], [place]. Per PDMP review, patient has not been filling reported prescription of oxycodone 30 mg 6 times daily. Unclear of validity. Oxycodone increased to 10 mg q 4hours -Continue increased dose of oxycodone 5-10 mg q4 hours as needed   Primary hypertension BP fairly controlled on clonidine twice daily, metoprolol  Dysphagia Patient failed swallow study postextubation. Speech therapy reevaluated again today plan for MBS whenever possible.  Continue NG tube feeding for now  Class I Obesity: Patient's Body mass index is 35.83 kg/m. : Will benefit with PCP follow-up, weight loss  healthy lifestyle and outpatient sleep evaluation.  DVT prophylaxis: enoxaparin (LOVENOX) injection 40 mg Start: 05/12/23 1600 SCD's Start: 05/10/23 0008 SCDs Start: 05/06/23 1819 Code Status:   Code Status: Full Code Family Communication: plan of care discussed with patient at bedside. Patient status is: Remains hospitalized because of severity of illness Level of care: Progressive   Dispo: The patient is from: HOME            Anticipated disposition: TBD Objective: Vitals last 24 hrs: Vitals:   05/17/23 0008 05/17/23 0400 05/17/23 0528 05/17/23 0809  BP: 119/72  (!) 145/95 (!) 159/96  Pulse:      Resp:    (!) 24  Temp:   98 F (36.7 C) 100.1 F (37.8 C)  TempSrc:   Axillary  Oral  SpO2:  96%    Weight:      Height:       Weight change:   Physical Examination: General exam: alert awake,at baseline, older than stated age HEENT:Oral mucosa moist, Ear/Nose WNL grossly Respiratory system: Bilaterally clear BS,no use of accessory muscle Cardiovascular system: S1 & S2 +, No JVD. Gastrointestinal system: Abdomen soft,NT,ND, BS+ NGT+ Nervous System: Alert, awake, moving lower extremities and right side well, gross weakness in the left upper arm  Extremities: LE edema + B/L ,distal peripheral pulses palpable and warm.  Skin: No rashes,no icterus. MSK: Normal muscle bulk,tone, power  Foley catheter in place  Medications reviewed:  Scheduled Meds:  Chlorhexidine Gluconate Cloth  6 each Topical Daily   clonazepam  0.25 mg Oral TID   cloNIDine  0.1 mg Per Tube BID   docusate  100 mg Per Tube BID   enoxaparin (LOVENOX) injection  40 mg Subcutaneous Q24H   famotidine  20 mg Per Tube BID   feeding supplement (PROSource TF20)  60 mL Per Tube BID   folic acid  1 mg Per Tube Daily   free water  200 mL Per Tube Q4H   gabapentin  300 mg Per Tube Q8H   influenza vac split trivalent PF  0.5 mL Intramuscular Tomorrow-1000   insulin aspart  0-20 Units Subcutaneous Q4H   insulin aspart  4 Units Subcutaneous Q4H   insulin glargine-yfgn  24  Units Subcutaneous BID   metoprolol tartrate  25 mg Per Tube BID   multivitamin with minerals  1 tablet Per Tube Daily   mouth rinse  15 mL Mouth Rinse 4 times per day   polyethylene glycol  17 g Per Tube BID   QUEtiapine  50 mg Oral BID   thiamine (VITAMIN B1) injection  100 mg Intravenous Daily   venlafaxine XR  150 mg Oral Q breakfast   Continuous Infusions:  ceFEPime (MAXIPIME) IV 2 g (05/17/23 0458)   feeding supplement (VITAL 1.5 CAL) 1,000 mL (05/16/23 1150)      Diet Order             Diet NPO time specified  Diet effective now                 Nutrition Problem: Inadequate oral intake Etiology: acute  illness Signs/Symptoms: NPO status Interventions: Refer to RD note for recommendations, Tube feeding   Intake/Output Summary (Last 24 hours) at 05/17/2023 1025 Last data filed at 05/17/2023 0813 Gross per 24 hour  Intake --  Output 3350 ml  Net -3350 ml   Net IO Since Admission: -9,386.14 mL [05/17/23 1025]  Wt Readings from Last 3 Encounters:  05/16/23 126.6 kg     Unresulted Labs (From admission, onward)    None     Data Reviewed: I have personally reviewed following labs and imaging studies CBC: Recent Labs  Lab 05/11/23 0241 05/12/23 0236 05/13/23 0334 05/14/23 0805  WBC 12.4* 13.3* 13.7* 12.1*  NEUTROABS  --  8.9*  --  7.4  HGB 11.9* 11.2* 12.1* 11.9*  HCT 36.7* 34.5* 37.6* 36.7*  MCV 87.6 87.6 89.1 87.2  PLT 321 331 351 401*   Basic Metabolic Panel:  Recent Labs  Lab 05/11/23 0241 05/12/23 0236 05/13/23 0334 05/14/23 0805 05/15/23 0425  NA 136 141 142 138 138  K 3.5 3.6 3.6 3.6 3.7  CL 103 109 109 108 106  CO2 25 25 22  21* 25  GLUCOSE 257* 209* 126* 237* 251*  BUN 15 14 15 17 14   CREATININE 0.99 0.96 0.97 0.88 0.86  CALCIUM 8.8* 8.9 8.6* 8.7* 8.9  MG 2.1 2.0 2.0  --   --   PHOS  --   --  2.7  --   --    GFR: Estimated Creatinine Clearance: 135.7 mL/min (by C-G formula based on SCr of 0.86 mg/dL). Liver Function Tests:  Recent Labs  Lab 05/11/23 0241 05/14/23 0805 05/15/23 0425  AST 47* 86* 46*  ALT 79* 150* 113*  ALKPHOS 137* 118 102  BILITOT 0.7 0.5 0.6  PROT 7.3 7.3 7.1  ALBUMIN 2.5* 2.6* 2.5*   Recent Results (from the past 240 hours)  Culture, Respiratory w Gram Stain     Status: None   Collection Time: 05/10/23 12:32 PM   Specimen: Tracheal Aspirate; Respiratory  Result Value Ref Range Status   Specimen Description TRACHEAL ASPIRATE  Final   Special Requests NONE  Final   Gram Stain   Final    ABUNDANT WBC PRESENT, PREDOMINANTLY PMN RARE GRAM NEGATIVE RODS RARE BUDDING YEAST SEEN Performed at [facility], [address], [place], [place] [address]    Culture   Final    ABUNDANT ENTEROBACTER CLOACAE RARE PSEUDOMONAS AERUGINOSA    Report Status 05/13/2023 FINAL  Final   Organism ID, Bacteria ENTEROBACTER CLOACAE  Final   Organism ID, Bacteria PSEUDOMONAS AERUGINOSA  Final      Susceptibility  Enterobacter cloacae - MIC*    CEFEPIME <=0.12 SENSITIVE Sensitive     CEFTAZIDIME <=1 SENSITIVE Sensitive     CIPROFLOXACIN <=0.25 SENSITIVE Sensitive     GENTAMICIN <=1 SENSITIVE Sensitive     IMIPENEM <=0.25 SENSITIVE Sensitive     TRIMETH/SULFA <=20 SENSITIVE Sensitive     PIP/TAZO <=4 SENSITIVE Sensitive ug/mL    * ABUNDANT ENTEROBACTER CLOACAE   Pseudomonas aeruginosa - MIC*    CEFTAZIDIME 4 SENSITIVE Sensitive     CIPROFLOXACIN 0.5 SENSITIVE Sensitive     GENTAMICIN <=1 SENSITIVE Sensitive     IMIPENEM 1 SENSITIVE Sensitive     CEFEPIME 4 SENSITIVE Sensitive     * RARE PSEUDOMONAS AERUGINOSA    Antimicrobials/Microbiology: Anti-infectives (From admission, onward)    Start     Dose/Rate Route Frequency Ordered Stop   05/13/23 1300  ceFEPIme (MAXIPIME) 2 g in sodium chloride 0.9 % 100 mL IVPB        2 g 200 mL/hr over 30 Minutes Intravenous Every 8 hours 05/13/23 1156     05/10/23 1330  piperacillin-tazobactam (ZOSYN) IVPB 3.375 g  Status:  Discontinued        3.375 g 12.5 mL/hr over 240 Minutes Intravenous Every 8 hours 05/10/23 1240 05/13/23 1156   05/06/23 2000  Ampicillin-Sulbactam (UNASYN) 3 g in sodium chloride 0.9 % 100 mL IVPB  Status:  Discontinued        3 g 200 mL/hr over 30 Minutes Intravenous Every 6 hours 05/06/23 1919 05/10/23 1214         Component Value Date/Time   SDES TRACHEAL ASPIRATE 05/10/2023 1232   SPECREQUEST NONE 05/10/2023 1232   CULT  05/10/2023 1232    ABUNDANT ENTEROBACTER CLOACAE RARE PSEUDOMONAS AERUGINOSA    REPTSTATUS 05/13/2023 FINAL 05/10/2023 1232     Radiology Studies: No results found.   LOS: 11 days   Total time spent in review of labs and  imaging, patient evaluation, formulation of plan, documentation and communication with family: 35 minutes  Glenn Boast, MD [facility]  05/17/2023, 10:25 AM

## 2023-05-17 NOTE — Plan of Care (Signed)
  Problem: SLP Dysphagia Goals Goal: Patient will demonstrate readiness for PO's Description: Patient will demonstrate readiness for PO's and/or instrumental swallow study as evidenced by: Outcome: Completed/Met   Problem: SLP Dysphagia Goals Goal: Misc Dysphagia Goal Flowsheets (Taken 05/17/2023 1521) Misc Dysphagia Goal: Pt will tolerate a mechanical soft diet and thin liquids with no overt or subtle s/s of aspiration or decline in pulmonary function.

## 2023-05-18 DIAGNOSIS — I469 Cardiac arrest, cause unspecified: Secondary | ICD-10-CM | POA: Diagnosis not present

## 2023-05-18 NOTE — Consult Note (Signed)
Consultation  Referring Provider:      Primary Care Physician:  Fleet Contras, MD Primary Gastroenterologist:     N/A    Reason for Consultation:     GI bleed         HPI:   Glenn Henderson is a 57 y.o. male with a history of diabetes, sleep apnea chronic back pain, depression, DVT/PE on Eliquis, PTSD who was admitted December 16 with out-of-hospital PEA arrest, achieved ROSC after 2 rounds of CPR.  He was intubated and admitted to the ICU for management of respiratory failure and also found to have a cervical spine fracture involving C1 requiring odontoid screw fixation, presumably which occurred as result of the fall from his PEA arrest.  His hospitalization had been further complicated by pneumonia, pain management, delirium and suicidal ideation.  He was progressing towards inpatient rehab, but then this morning experienced a large bloody bowel movement. He denies any other GI symptoms such as abdominal pain, nausea, vomiting.  He had been receiving tube feeds, but his diet was advanced yesterday for the first time since his hospitalization.  He did report feeling a little dizzy when he stood up this morning.  He was tachycardic on my exam, but he has not been hypotensive. The patient's bedside commode was notable for a large amount of dark red stool.  He has not been on any blood thinners since his hospitalization due to recent neurosurgery.  His hemoglobin this morning was 13, which is up slightly from his previous hemoglobins during this admission (11-12).  His BUN was not elevated at 13.  He has not been taking NSAIDs.  He does require chronic narcotics for his back and neck pain.  He denies any prior history of GI bleeding.  He reports to me having a colonoscopy "5 or 10 years ago" at [facility].  I was not able to find this colonoscopy report. No known history of diverticulosis.  No obvious diverticulosis on CT scan on admission.   History reviewed. No pertinent past medical  history.  Past Surgical History:  Procedure Laterality Date   ODONTOID SCREW INSERTION N/A 05/09/2023   Procedure: ODONTOID SCREW FIXATION WITH FLOUROSCOPIC GUIDANCE;  Surgeon: Barnett Abu, MD;  Location: [facility] OR;  Service: Neurosurgery;  Laterality: N/A;   ODONTOID SCREW INSERTION N/A 05/09/2023   Procedure: ODONTOID SCREW INSERTION;  Surgeon: Barnett Abu, MD;  Location: [facility] OR;  Service: Neurosurgery;  Laterality: N/A;    History reviewed. No pertinent family history.   Social History   Tobacco Use   Smoking status: Some Days    Types: Cigarettes   Tobacco comments:    Per chart review    Prior to Admission medications   Medication Sig Start Date End Date Taking? Authorizing Provider  acetaminophen-codeine (TYLENOL #3) 300-30 MG tablet Take 1 tablet by mouth every 4 (four) hours as needed. 04/22/23   [provider]  atorvastatin (LIPITOR) 40 MG tablet Take 40 mg by mouth daily. 04/13/23   [provider]  chlorhexidine (PERIDEX) 0.12 % solution Use as directed 15 mLs in the mouth or throat in the morning, at noon, and at bedtime. 04/22/23   [provider]  clonazePAM (KLONOPIN) 0.5 MG tablet Take 0.5 mg by mouth 2 (two) times daily as needed. 04/12/23   [provider]  cloNIDine (CATAPRES) 0.1 MG tablet Take 0.1 mg by mouth 2 (two) times daily as needed. 04/12/23   [provider]  DULoxetine (CYMBALTA) 30 MG capsule Take  30 mg by mouth every morning. 05/01/23   [provider]  ELIQUIS 5 MG TABS tablet Take 5 mg by mouth 2 (two) times daily. 03/18/23   [provider]  fluticasone (FLONASE) 50 MCG/ACT nasal spray Place 2 sprays into both nostrils at bedtime. 04/28/23   [provider]  furosemide (LASIX) 20 MG tablet Take 20 mg by mouth daily as needed. 04/01/23   [provider]  furosemide (LASIX) 40 MG tablet Take 40 mg by mouth daily as needed. 04/08/23   [provider]  gabapentin  (NEURONTIN) 800 MG tablet Take 800 mg by mouth 3 (three) times daily. 04/09/23   [provider]  HUMALOG KWIKPEN 100 UNIT/ML KwikPen Inject 2-10 Units into the skin as directed. 04/08/23   [provider]  hydrochlorothiazide (HYDRODIURIL) 12.5 MG tablet Take 12.5 mg by mouth daily. 04/23/23   [provider]  losartan (COZAAR) 100 MG tablet Take 100 mg by mouth daily. 04/20/23   [provider]  mirtazapine (REMERON) 45 MG tablet Take 45 mg by mouth at bedtime. 05/01/23   [provider]  oxyCODONE-acetaminophen (PERCOCET/ROXICET) 5-325 MG tablet Take 1 tablet by mouth every 6 (six) hours as needed. 04/19/23   [provider]  sildenafil (REVATIO) 20 MG tablet Take 20 mg by mouth as needed. 04/19/23   [provider]  TRULICITY 1.5 MG/0.5ML SOAJ Inject 1.5 mg into the skin once a week. 03/29/23   [provider]  Vitamin D, Ergocalciferol, (DRISDOL) 1.25 MG (50000 UNIT) CAPS capsule Take 50,000 Units by mouth once a week. 04/20/23   [provider]    Current Facility-Administered Medications  Medication Dose Route Frequency Provider Last Rate Last Admin   acetaminophen (TYLENOL) tablet 650 mg  650 mg Per Tube Q4H PRN Cheri Fowler, MD   650 mg at 05/16/23 1723   Or   acetaminophen (TYLENOL) suppository 650 mg  650 mg Rectal Q4H PRN Cheri Fowler, MD       albuterol (PROVENTIL) (2.5 MG/3ML) 0.083% nebulizer solution 2.5 mg  2.5 mg Nebulization Q4H PRN Selmer Dominion B, NP       ceFEPIme (MAXIPIME) 2 g in sodium chloride 0.9 % 100 mL IVPB  2 g Intravenous Q8H Narda Bonds, MD   Stopped at 05/18/23 0434   Chlorhexidine Gluconate Cloth 2 % PADS 6 each  6 each Topical Daily Barnett Abu, MD   6 each at 05/18/23 0857   clonazePAM (KLONOPIN) disintegrating tablet 0.25 mg  0.25 mg Oral TID Tomie China, MD   0.25 mg at 05/18/23 1610   cloNIDine (CATAPRES) tablet 0.1 mg  0.1 mg Per Tube BID Selmer Dominion B, NP    0.1 mg at 05/18/23 0902   docusate (COLACE) 50 MG/5ML liquid 100 mg  100 mg Per Tube BID Barnett Abu, MD   100 mg at 05/17/23 2146   enoxaparin (LOVENOX) injection 40 mg  40 mg Subcutaneous Q24H Cheri Fowler, MD   40 mg at 05/17/23 1637   famotidine (PEPCID) tablet 20 mg  20 mg Per Tube BID Barnett Abu, MD   20 mg at 05/18/23 0902   feeding supplement (ENSURE ENLIVE / ENSURE PLUS) liquid 237 mL  237 mL Oral BID BM Kc, Ramesh, MD       feeding supplement (PROSource TF20) liquid 60 mL  60 mL Per Tube BID Barnett Abu, MD   60 mL at 05/18/23 0902   feeding supplement (VITAL 1.5 CAL) liquid 1,000 mL  1,000  mL Per Tube Continuous Barnett Abu, MD 60 mL/hr at 05/18/23 0700 Infusion Verify at 05/18/23 0700   folic acid (FOLVITE) tablet 1 mg  1 mg Per Tube Daily Barnett Abu, MD   1 mg at 05/18/23 0902   free water 200 mL  200 mL Per Tube Q4H Narda Bonds, MD   200 mL at 05/18/23 0900   gabapentin (NEURONTIN) 250 MG/5ML solution 300 mg  300 mg Per Tube Q8H Josiah Lobo T, MD   300 mg at 05/18/23 0600   guaiFENesin (ROBITUSSIN) 100 MG/5ML liquid 15 mL  15 mL Per Tube Q6H PRN Selmer Dominion B, NP       hydrALAZINE (APRESOLINE) injection 10 mg  10 mg Intravenous Q6H PRN Cheri Fowler, MD   10 mg at 05/14/23 2227   influenza vac split trivalent PF (FLULAVAL) injection 0.5 mL  0.5 mL Intramuscular Tomorrow-1000 Barnett Abu, MD       insulin aspart (novoLOG) injection 0-20 Units  0-20 Units Subcutaneous Q4H Barnett Abu, MD   7 Units at 05/18/23 0900   insulin aspart (novoLOG) injection 4 Units  4 Units Subcutaneous Q4H Selmer Dominion B, NP   4 Units at 05/18/23 0901   insulin glargine-yfgn (SEMGLEE) injection 27 Units  27 Units Subcutaneous BID Lanae Boast, MD   27 Units at 05/18/23 0854   metoprolol tartrate (LOPRESSOR) 25 mg/10 mL oral suspension 25 mg  25 mg Per Tube BID Narda Bonds, MD   25 mg at 05/18/23 0856   multivitamin with minerals tablet 1 tablet  1 tablet Per Tube Daily Barnett Abu, MD   1 tablet at 05/18/23 0855   naloxone (NARCAN) injection 0.4 mg  0.4 mg Intravenous PRN Ranee Gosselin, MD       ondansetron (ZOFRAN) injection 4 mg  4 mg Intravenous Q6H PRN Barnett Abu, MD       Oral care mouth rinse  15 mL Mouth Rinse 4 times per day Cheri Fowler, MD   15 mL at 05/18/23 0901   Oral care mouth rinse  15 mL Mouth Rinse PRN Cheri Fowler, MD       oxyCODONE (Oxy IR/ROXICODONE) immediate release tablet 5 mg  5 mg Oral Once Kc, Dayna Barker, MD       oxyCODONE (Oxy IR/ROXICODONE) immediate release tablet 5-10 mg  5-10 mg Per Tube Q4H PRN Narda Bonds, MD   10 mg at 05/18/23 0600   pantoprazole (PROTONIX) EC tablet 40 mg  40 mg Oral BID Kc, Dayna Barker, MD   40 mg at 05/18/23 0902   polyethylene glycol (MIRALAX / GLYCOLAX) packet 17 g  17 g Per Tube BID Selmer Dominion B, NP   17 g at 05/17/23 2140   QUEtiapine (SEROQUEL) tablet 50 mg  50 mg Oral BID Cheri Fowler, MD   50 mg at 05/18/23 0902   sodium phosphate (FLEET) enema 1 enema  1 enema Rectal Once PRN Barnett Abu, MD       thiamine (VITAMIN B1) injection 100 mg  100 mg Intravenous Daily Barnett Abu, MD   100 mg at 05/18/23 0902   venlafaxine XR (EFFEXOR-XR) 24 hr capsule 150 mg  150 mg Oral Q breakfast Tomie China, MD   150 mg at 05/18/23 8295    Allergies as of 05/06/2023   (Not on File)     Review of Systems:    As per HPI, otherwise negative    Physical Exam:  Vital signs in last 24 hours: Temp:  [  97.8 F (36.6 C)-98.7 F (37.1 C)] 98.7 F (37.1 C) (12/28 0442) Pulse Rate:  [91-110] 100 (12/28 0600) Resp:  [16-20] 18 (12/28 0600) BP: (126-146)/(72-84) 137/80 (12/28 0200) SpO2:  [86 %-100 %] 98 % (12/28 0600) Weight:  [126.6 kg] 126.6 kg (12/28 0500) Last BM Date : 05/15/23 General:   Pleasant obese African-American male in NAD, sitting in bedside chair Head:  Normocephalic and atraumatic. Eyes:   No icterus.   Conjunctiva pink. Ears:  Normal auditory acuity. Neck:  Supple, in soft  c-collar Lungs:  Respirations even and unlabored. Lungs clear to auscultation bilaterally.   No wheezes, crackles, or rhonchi.  Heart: Tachycardic; no MRG Abdomen:  Soft, nondistended, nontender. Normal bowel sounds. No appreciable masses or hepatomegaly.  Rectal:  Not performed.  Msk:  Symmetrical without gross deformities.  Extremities:  Without edema. Neurologic:  Alert and  oriented x4 Skin:  Intact without significant lesions or rashes. Psych:  Alert and cooperative. Normal affect.  LAB RESULTS: Recent Labs    05/18/23 0352  WBC 16.7*  HGB 13.0  HCT 41.0  PLT 561*   BMET Recent Labs    05/18/23 0352  NA 140  K 4.1  CL 103  CO2 27  GLUCOSE 218*  BUN 13  CREATININE 0.91  CALCIUM 9.5   LFT No results for input(s): "PROT", "ALBUMIN", "AST", "ALT", "ALKPHOS", "BILITOT", "BILIDIR", "IBILI" in the last 72 hours. PT/INR No results for input(s): "LABPROT", "INR" in the last 72 hours.  STUDIES: DG Swallowing Func-Speech Pathology Result Date: 05/17/2023 Table formatting from the original result was not included. Modified Barium Swallow Study Patient Details Name: Glenn Henderson MRN: [id number] Date of Birth: 1966-05-08 Today's Date: 05/17/2023 HPI/PMH: HPI: Pt is a 57 y.o. 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 CT with type II odontoid fx; 12/19 underwent anterior screw fixation with fluoroscopic guidance and replacement of odontoid screw. Bilateral nasal bone fxs. EEG with severe diffuse encephalopathy. ETT 12/16-21. 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 MR cervical spine 12/18, relevant to swallowing is small prevertebral effusion extending from C1-C5, measuring up to 8 mm. Clinical Impression: Pt presents with a mild oropharyngeal dysphagia per results of MBSS  completed today. View of trachea was partially obstructed by pt's shoulder position during the study. No aspiration observed across trials. Oral deficits characterized by reduced oral strength and coordination resulting in posterior spillage of liquids to the valleculae before the swallow. There was  increased oral prep and transit time with piecemeal deglutition of nectar-thick liquids, pudding, and soft solid trials. There was trace-min oral residue following most trials, which cleared with spontaneous, subsequent swallow. Pharyngeal deficits characterized by reduced base of tongue retraction, and reduced laryngeal vestibule closure, which resulted in transient, shallow penetration of consecutive sips of thin liquids by cup/straw. There was x1 instance of transient, shallow penetration of thin liquids by sing straw sip, otherwise, small sips were effective for eliminating penetration. Detailed diet recommendations and aspiration precautions outlined below. Education: SLP reviewed MBSS findings and detailed diet recommendations with pt following the study. Used teach back method to review compensatory swallow strategies and aspiration precautions with pt. Good understanding verbalized with all questions pertaining to swallowing answered at this time. Factors that may increase risk of adverse event in presence of aspiration [name] & [name]  2021): Factors that may increase risk of adverse event in presence of aspiration [name] & [name] 2021): Frail or deconditioned; Presence of tubes (ETT, trach, NG, etc.) Recommendations/Plan: Swallowing Evaluation Recommendations Swallowing Evaluation Recommendations Recommendations: PO diet PO Diet Recommendation: Dysphagia 3 (Mechanical soft); Thin liquids (Level 0) Liquid Administration via: Cup; Straw Medication Administration: Whole meds with puree Supervision: Staff to assist with self-feeding; Full supervision/cueing for swallowing strategies Swallowing strategies  :  Slow rate; Small bites/sips; Multiple dry swallows after each bite/sip Postural changes: Position pt fully upright for meals Oral care recommendations: Oral care BID (2x/day) Treatment Plan Treatment Plan Treatment recommendations: Therapy as outlined in treatment plan below Follow-up recommendations: -- (TBD) Functional status assessment: Patient has had a recent decline in their functional status and demonstrates the ability to make significant improvements in function in a reasonable and predictable amount of time. Treatment frequency: Min 1x/week Treatment duration: 1 week Interventions: Aspiration precaution training; Compensatory techniques; Patient/family education; Trials of upgraded texture/liquids; Diet toleration management by SLP Recommendations Recommendations for follow up therapy are one component of a multi-disciplinary discharge planning process, led by the attending physician.  Recommendations may be updated based on patient status, additional functional criteria and insurance authorization. Assessment: Orofacial Exam: Orofacial Exam Oral Cavity - Dentition: Adequate natural dentition Anatomy: Anatomy: WFL; Presence of cervical hardware Boluses Administered: Boluses Administered Boluses Administered: Thin liquids (Level 0); Mildly thick liquids (Level 2, nectar thick); Puree; Solid  Oral Impairment Domain: Oral Impairment Domain Lip Closure: No labial escape Tongue control during bolus hold: Posterior escape of less than half of bolus Bolus preparation/mastication: Timely and efficient chewing and mashing Bolus transport/lingual motion: Delayed initiation of tongue motion (oral holding) Oral residue: Residue collection on oral structures Location of oral residue : Tongue Initiation of pharyngeal swallow : Valleculae  Pharyngeal Impairment Domain: Pharyngeal Impairment Domain Soft palate elevation: No bolus between soft palate (SP)/pharyngeal wall (PW) Laryngeal elevation: Complete superior movement  of thyroid cartilage with complete approximation of arytenoids to epiglottic petiole Anterior hyoid excursion: Complete anterior movement Epiglottic movement: Complete inversion Laryngeal vestibule closure: Incomplete, narrow column air/contrast in laryngeal vestibule Pharyngeal stripping wave : Present - complete Pharyngeal contraction (A/P view only): N/A Pharyngoesophageal segment opening: Complete distension and complete duration, no obstruction of flow Tongue base retraction: Trace column of contrast or air between tongue base and PPW Pharyngeal residue: Trace residue within or on pharyngeal structures Location of pharyngeal residue: Tongue base  Esophageal Impairment Domain: Esophageal Impairment Domain Esophageal clearance upright position: -- (not assessed) Pill: No data recorded Penetration/Aspiration Scale Score: Penetration/Aspiration Scale Score 1.  Material does not enter airway: Thin liquids (Level 0); Mildly thick liquids (Level 2, nectar thick); Puree; Solid 2.  Material enters airway, remains ABOVE vocal cords then ejected out: Thin liquids (Level 0) Compensatory Strategies: Compensatory Strategies Compensatory strategies: Yes Multiple swallows: Effective Effective Multiple Swallows: Solid; Puree; Mildly thick liquid (Level 2, nectar thick); Thin liquid (Level 0)   General Information: Caregiver present: No  Diet Prior to this Study: NPO; Cortrak/Small bore NG tube   Temperature : Normal   Respiratory Status: WFL   Supplemental O2: Nasal cannula (3L)   History of Recent Intubation: Yes  Behavior/Cognition: Alert; Cooperative; Pleasant mood Self-Feeding Abilities: Dependent for feeding Baseline vocal quality/speech: Normal No data recorded Volitional Swallow: Able to elicit Exam Limitations: Limited visibility (due to shoulder positioning) Goal Planning: Prognosis for improved oropharyngeal function: Good No data recorded No data recorded Patient/Family Stated Goal: start PO diet Consulted and agree  with results and recommendations: Patient; Nurse Pain: Pain Assessment Pain Assessment: No/denies pain Faces Pain Scale: 0 Facial Expression: 0 Body Movements: 0 Muscle Tension: 0 Compliance with ventilator (intubated pts.): N/A Vocalization (extubated pts.): 0 CPOT Total: 0 End of Session: Start Time:SLP Start Time (ACUTE ONLY): 1355 Stop Time: SLP Stop Time (ACUTE ONLY): 1425 Time Calculation:SLP Time Calculation (min) (ACUTE ONLY): 30 min Charges: SLP Evaluations $ SLP Speech Visit: 1 Visit SLP Evaluations $MBS Swallow: 1 Procedure $Swallowing Treatment: 1 Procedure SLP visit diagnosis: SLP Visit Diagnosis: Dysphagia, oropharyngeal phase (R13.12) Past Medical History: No past medical history on file. Past Surgical History: Past Surgical History: Procedure Laterality Date  ODONTOID SCREW INSERTION N/A 05/09/2023  Procedure: ODONTOID SCREW FIXATION WITH FLOUROSCOPIC GUIDANCE;  Surgeon: Barnett Abu, MD;  Location: [facility] OR;  Service: Neurosurgery;  Laterality: N/A;  ODONTOID SCREW INSERTION N/A 05/09/2023  Procedure: ODONTOID SCREW INSERTION;  Surgeon: Barnett Abu, MD;  Location: [facility] OR;  Service: Neurosurgery;  Laterality: N/A; Ellery Plunk 05/17/2023, 3:52 PM    PREVIOUS ENDOSCOPIES:            Patient report of colonoscopy 5 or 10 years ago, unable to locate record of procedure    Impression / Plan:   57 year old male with history of DVT/PE, obesity, sleep apnea, diabetes, chronic pain, depression/PTSD, admitted following PEA arrest of unknown clear etiology, complicated by C1 fracture repaired with odontoid screw, recovering and progressing towards rehab, but who experienced a large volume maroon-colored bowel movement this morning with associated lightheadedness.  No symptoms such as abdominal pain to be concerning for ischemic colitis.  BUN not elevated and no symptoms such as nausea, vomiting or hematemesis, so very low suspicion for brisk upper GI bleed. The clinical presentation is most  consistent with a diverticular bleed.  I discussed with the patient the pathophysiology of diverticular bleeds and the typical clinical history which consists of brisk bleeding, which often subsides spontaneously.  In cases of persistent and severe bleeding, hemostatic intervention may be warranted.  A CTA is the fastest way to identify a diverticular bleed and provides away for prompt treatment with embolization.  We discussed the role of colonoscopy in evaluating lower GI bleeds, and the low rate of successful hemostasis for diverticular bleeding.  Although a colonoscopy is certainly reasonable this patient who does not have a clear prior history of colonoscopy, given his recent neck fracture/surgery and immobility, performing a bowel prep and sedated for colonoscopy will be more difficult and risky.  Therefore, I recommend we proceed with a CTA and embolization if positive.  If CTA negative, would recommend observation overnight to see if bleeding subsides.  If bleeding persists, then can consider bowel prep Sunday and possible colonoscopy Monday.  GI bleeding, suspect diverticular - CTA today, consult IR if positive - If CTA negative, observe overnight, and reconsider prepping for colonoscopy on Monday if continued bleeding - Would put patient on clear or full liquid diet for now   Acute respiratory failure with hypoxia/multifocal pneumonia, cultures positive for Pseudomonas and Enterobacter - Satting well on 4 L - On cefepime  Status post PEA arrest out-of-hospital - Etiology unclear - Echo with normal EF and no wall motion abnormalities  Unstable C1-2 fracture - Status post undone toyed screw, currently in soft cervical collar, undergoing rehab  History of DVT/PE - On Eliquis as outpatient, has not received since admission due to fracture/recent neurosurgery - Continue to hold  Elevated liver enzymes, fatty liver - Most likely metabolic associated steatotic  liver disease - Will need  workup to exclude other causes of chronic liver disease (viral, autoimmune  Thanks   LOS: 12 days   Jenel Lucks  05/18/2023, 9:04 AM

## 2023-05-18 NOTE — Plan of Care (Signed)
°  Problem: Education: Goal: Ability to describe self-care measures that may prevent or decrease complications (Diabetes Survival Skills Education) will improve Outcome: Progressing   Problem: Coping: Goal: Ability to adjust to condition or change in health will improve Outcome: Progressing   Problem: Fluid Volume: Goal: Ability to maintain a balanced intake and output will improve Outcome: Progressing   Problem: Health Behavior/Discharge Planning: Goal: Ability to identify and utilize available resources and services will improve Outcome: Progressing Goal: Ability to manage health-related needs will improve Outcome: Progressing   Problem: Metabolic: Goal: Ability to maintain appropriate glucose levels will improve Outcome: Progressing   Problem: Nutritional: Goal: Maintenance of adequate nutrition will improve Outcome: Progressing Goal: Progress toward achieving an optimal weight will improve Outcome: Progressing   Problem: Skin Integrity: Goal: Risk for impaired skin integrity will decrease Outcome: Progressing   Problem: Education: Goal: Knowledge of General Education information will improve Description: Including pain rating scale, medication(s)/side effects and non-pharmacologic comfort measures Outcome: Progressing   Problem: Health Behavior/Discharge Planning: Goal: Ability to manage health-related needs will improve Outcome: Progressing   Problem: Clinical Measurements: Goal: Ability to maintain clinical measurements within normal limits will improve Outcome: Progressing Goal: Will remain free from infection Outcome: Progressing Goal: Diagnostic test results will improve Outcome: Progressing Goal: Respiratory complications will improve Outcome: Progressing   Problem: Activity: Goal: Risk for activity intolerance will decrease Outcome: Progressing   Problem: Nutrition: Goal: Adequate nutrition will be maintained Outcome: Progressing   Problem:  Coping: Goal: Level of anxiety will decrease Outcome: Progressing   Problem: Elimination: Goal: Will not experience complications related to bowel motility Outcome: Progressing Goal: Will not experience complications related to urinary retention Outcome: Progressing   Problem: Pain Management: Goal: General experience of comfort will improve Outcome: Progressing   Problem: Safety: Goal: Ability to remain free from injury will improve Outcome: Progressing   Problem: Skin Integrity: Goal: Risk for impaired skin integrity will decrease Outcome: Progressing

## 2023-05-18 NOTE — Progress Notes (Signed)
There was consult for placing a PIV access for CT angio. Assessed Lt. Arm which is weakness due to CVA, patient has big scar on Rt. UA ( near axillary area). Patient's RN [facility]) stated that it is okay to put in a PIV access on Rt side arm. Placing a PIV access on Rt. AC. HS Sabra Heck

## 2023-05-18 NOTE — Progress Notes (Signed)
Patient is displaying bleeding from his rectum including big blood clots. MD has been notified. Vitals are stable. No complaints of pain or dizziness.

## 2023-05-18 NOTE — Progress Notes (Signed)
PROGRESS NOTE Glenn Henderson  ZOX:[id number] DOB: 1965-08-14 DOA: 05/06/2023 PCP: Fleet Contras, MD  Brief Narrative/Hospital Course: Glenn Henderson is a 57 y.o. male with a history of hyperlipidemia, OSA, hepatitis, major depressive disorder, anxiety, PTSD, tobacco use, diabetes mellitus type 2, peripheral neuropathy, chronic radicular lumbar pain with chronic pain syndrome.  Patient presented secondary to out of hospital PEA arrest requiring 2 rounds of CPR prior to ROSC.  Patient intubated and admitted to the ICU for management of respiratory failure.  Patient also suffered a cervical spine fracture involving C1 requiring odontoid screw fixation by neurosurgery.  Hospitalization further complicated by multifocal pneumonia, pain management, delirium, suicidal ideation. At this point patient is for acute inpatient rehabilitation pending improvement in vitals and pain tolerance.   Subjective: Seen and examined On bedside chair C/o chronic back and neck pain  No abdomen pain nausea or vomiting Overnight patient had been afebrile BP stable on 4 L [place]  This morning nursing noticed while wiping the bottom he was having clots Labs with hemoglobin stable and normal at 13 g  Assessment and Plan: Principal Problem:   Cardiac arrest [facility]) Active Problems:   Closed fracture of nasal bones   Delirium due to another medical condition  S/p PEA cardiac arrest out of hospital: Unclear etiology. EEG without evidence of seizures. WJX:BJYNWG RV, LVEF of 60-65% and no regional wall motion abnormality.  Currently doing well, normal medically stable   Acute respiratory failure with hypoxia In the setting of cardiac arrest needing mechanical ventilation until 12/21.  Currently on 4 L nasal cannula, continue supplemental oxygen and wean as tolerated  Blood clot/rectal bleeding: Hemoglobin and vitals stable, anticoagulation from home has been on hold will consulted GI-discussed with Dr. Tomasa Rand will trend H/H, add  PPI twice daily.GI advised CTA angio bleed- pt is agreeable.HOLD LOVENOX. Recent Labs  Lab 05/12/23 0236 05/13/23 0334 05/14/23 0805 05/18/23 0352 05/18/23 0910  HGB 11.2* 12.1* 11.9* 13.0 12.7*  HCT 34.5* 37.6* 36.7* 41.0 38.4*    Multifocal pneumonia CT chest>bilateral lower lobe infiltrates consistent with pneumonia .  Tracheal culture showed Enterobacter cloacae and Pseudomonas and will continue cefepime.he is afebrile.  Labs with leukocytosis. Cont to monitor  Low TSH Thyroid levels consistent with hyperthyroidism with low TSH, elevated free T4. Total T3 is normal, however. Patient started on metoprolol for cardiac effects and appears to be overall stable.  Will need endocrinology follow-up.   Delirium Anxiety Depression Mood remains stable on home Klonopin.  Delirium likely in the setting of multiple meds,prolonged hospitalization.  Mentation is stable and improved.   Unstable C-spine C1-C2 fracture: Secondary to fall outside the hospital.  Seen by Neurosurgery with odontoid screw fixation performed on 12/19 with subsequent need for replacement of odontoid screw. Continue soft cervical collar, plan for rehab.  Continue pain control   Nasal bone fractures Noted incidentally on CT imaging. Secondary to fall with head trauma. ENT with recommendations for no intervention.   Suicidal ideation Patient expressed suicidal ideation on 12/22-seen by psychiatry:IVC, Effexor, Seroquel, tapering Klonopin.  Seen again 12/26 advised one-to-one until discharge however overall low acute risk for suicide and has consistently denied in the last 3 days.   History of CVA History of TBI Previous left-sided ablation  the same.   History of DVT/PE Patient is on Eliquis as an outpatient which was held on admission. Neurosurgery cleared patient for resumption of DVT prophylaxis but will need to clarify if able to restart full dose anticoagulation soon. Cont to hold due  to GI bleeding.   Elevated  AST/ALT Unclear etiology. No obvious symptoms. Patient does have an elevated temperature, but no pain located in RUQ. [place] this admission significant for evidence of hepatic steatosis and hepatocellular disease. Lipitor held. CK normal.  Labs overall better monitor intermittently  Recent Labs  Lab 05/14/23 0805 05/15/23 0425  AST 86* 46*  ALT 150* 113*  ALKPHOS 118 102  BILITOT 0.5 0.6  PROT 7.3 7.1  ALBUMIN 2.6* 2.5*     Diabetes mellitus type 2 with uncontrolled hyperglycemia: A1c at 10, blood sugar poorly controlled, increase semglee and cont SSI  Recent Labs  Lab 05/17/23 1625 05/17/23 2049 05/18/23 0002 05/18/23 0440 05/18/23 0742  GLUCAP 250* 286* 237* 218* 237*    Peripheral neuropathy Stable on home Neurontin.  Also on Cymbalta at home held by psychiatry   Chronic pain On oxycodone PTA by Dr. Tollie Eth in [place], [place]. Per PDMP review, patient has not been filling reported prescription of oxycodone 30 mg 6 times daily. Unclear of validity. Oxycodone increased to 10 mg q 4hours -Continue same,minimize narcotic use, encourage  non-opiates    Primary hypertension BP is well-controlled.  Continue home clonidine twice daily, metoprolol  Dysphagia Patient failed swallow study postextubation. Speech therapy reevaluated , plan for MBS  at somepoint Continue NG tube feeding for now  Class I Obesity: Patient's Body mass index is 35.83 kg/m. : Will benefit with PCP follow-up, weight loss  healthy lifestyle and outpatient sleep evaluation.  DVT prophylaxis: enoxaparin (LOVENOX) injection 40 mg Start: 05/12/23 1600 SCDs Start: 05/06/23 1819 Code Status:   Code Status: Full Code Family Communication: plan of care discussed with patient at bedside. Patient status is: Remains hospitalized because of severity of illness Level of care: Progressive   Dispo: The patient is from: HOME            Anticipated disposition: TBD Objective: Vitals last 24 hrs: Vitals:   05/18/23 0400  05/18/23 0442 05/18/23 0500 05/18/23 0600  BP:      Pulse: 100   100  Resp: 16   18  Temp:  98.7 F (37.1 C)    TempSrc:  Oral    SpO2: 100%   98%  Weight:   126.6 kg   Height:       Weight change:   Physical Examination: General exam: alert awake, orientedx3 HEENT:Oral mucosa moist, Ear/Nose WNL grossly, soft neck collar+ Respiratory system: Bilaterally clear BS,no use of accessory muscle Cardiovascular system: S1 & S2 +, No JVD. Gastrointestinal system: Abdomen soft,NT,ND, BS+ Nervous System: Alert, awake, moving all extremities,and following commands. Extremities: LE edema neg,distal peripheral pulses palpable and warm.  Skin: No rashes,no icterus. MSK: Normal muscle bulk,tone, power   Medications reviewed:  Scheduled Meds:  Chlorhexidine Gluconate Cloth  6 each Topical Daily   clonazepam  0.25 mg Oral TID   cloNIDine  0.1 mg Per Tube BID   docusate  100 mg Per Tube BID   enoxaparin (LOVENOX) injection  40 mg Subcutaneous Q24H   famotidine  20 mg Per Tube BID   feeding supplement  237 mL Oral BID BM   feeding supplement (PROSource TF20)  60 mL Per Tube BID   folic acid  1 mg Per Tube Daily   free water  200 mL Per Tube Q4H   gabapentin  300 mg Per Tube Q8H   influenza vac split trivalent PF  0.5 mL Intramuscular Tomorrow-1000   insulin aspart  0-20 Units Subcutaneous Q4H   insulin  aspart  4 Units Subcutaneous Q4H   insulin glargine-yfgn  27 Units Subcutaneous BID   metoprolol tartrate  25 mg Per Tube BID   multivitamin with minerals  1 tablet Per Tube Daily   mouth rinse  15 mL Mouth Rinse 4 times per day   pantoprazole  40 mg Oral BID   polyethylene glycol  17 g Per Tube BID   QUEtiapine  50 mg Oral BID   thiamine (VITAMIN B1) injection  100 mg Intravenous Daily   venlafaxine XR  150 mg Oral Q breakfast   Continuous Infusions:  ceFEPime (MAXIPIME) IV Stopped (05/18/23 0434)   feeding supplement (VITAL 1.5 CAL) 60 mL/hr at 05/18/23 0700      Diet Order              DIET DYS 3 Room service appropriate? Yes; Fluid consistency: Thin  Diet effective now                 Nutrition Problem: Inadequate oral intake Etiology: acute illness Signs/Symptoms: NPO status Interventions: Refer to RD note for recommendations, Tube feeding   Intake/Output Summary (Last 24 hours) at 05/18/2023 0939 Last data filed at 05/18/2023 0700 Gross per 24 hour  Intake 1950 ml  Output 3600 ml  Net -1650 ml   Net IO Since Admission: -11,036.14 mL [05/18/23 0939]  Wt Readings from Last 3 Encounters:  05/18/23 126.6 kg     Unresulted Labs (From admission, onward)     Start     Ordered   05/18/23 0804  Hemoglobin and hematocrit, blood  Now then every 6 hours,   R (with TIMED occurrences)     Question:  Specimen collection method  Answer:  Lab=Lab collect   05/18/23 0803          Data Reviewed: I have personally reviewed following labs and imaging studies CBC: Recent Labs  Lab 05/12/23 0236 05/13/23 0334 05/14/23 0805 05/18/23 0352 05/18/23 0910  WBC 13.3* 13.7* 12.1* 16.7*  --   NEUTROABS 8.9*  --  7.4  --   --   HGB 11.2* 12.1* 11.9* 13.0 12.7*  HCT 34.5* 37.6* 36.7* 41.0 38.4*  MCV 87.6 89.1 87.2 90.1  --   PLT 331 351 401* 561*  --    Basic Metabolic Panel:  Recent Labs  Lab 05/12/23 0236 05/13/23 0334 05/14/23 0805 05/15/23 0425 05/18/23 0352  NA 141 142 138 138 140  K 3.6 3.6 3.6 3.7 4.1  CL 109 109 108 106 103  CO2 25 22 21* 25 27  GLUCOSE 209* 126* 237* 251* 218*  BUN 14 15 17 14 13   CREATININE 0.96 0.97 0.88 0.86 0.91  CALCIUM 8.9 8.6* 8.7* 8.9 9.5  MG 2.0 2.0  --   --   --   PHOS  --  2.7  --   --   --    GFR: Estimated Creatinine Clearance: 128.2 mL/min (by C-G formula based on SCr of 0.91 mg/dL). Liver Function Tests:  Recent Labs  Lab 05/14/23 0805 05/15/23 0425  AST 86* 46*  ALT 150* 113*  ALKPHOS 118 102  BILITOT 0.5 0.6  PROT 7.3 7.1  ALBUMIN 2.6* 2.5*   Recent Results (from the past 240 hours)  Culture,  Respiratory w Gram Stain     Status: None   Collection Time: 05/10/23 12:32 PM   Specimen: Tracheal Aspirate; Respiratory  Result Value Ref Range Status   Specimen Description TRACHEAL ASPIRATE  Final   Special Requests NONE  Final   Gram Stain   Final    ABUNDANT WBC PRESENT, PREDOMINANTLY PMN RARE GRAM NEGATIVE RODS RARE BUDDING YEAST SEEN Performed at [facility], [address] [place], [place] [address]    Culture   Final    ABUNDANT ENTEROBACTER CLOACAE RARE PSEUDOMONAS AERUGINOSA    Report Status 05/13/2023 FINAL  Final   Organism ID, Bacteria ENTEROBACTER CLOACAE  Final   Organism ID, Bacteria PSEUDOMONAS AERUGINOSA  Final      Susceptibility   Enterobacter cloacae - MIC*    CEFEPIME <=0.12 SENSITIVE Sensitive     CEFTAZIDIME <=1 SENSITIVE Sensitive     CIPROFLOXACIN <=0.25 SENSITIVE Sensitive     GENTAMICIN <=1 SENSITIVE Sensitive     IMIPENEM <=0.25 SENSITIVE Sensitive     TRIMETH/SULFA <=20 SENSITIVE Sensitive     PIP/TAZO <=4 SENSITIVE Sensitive ug/mL    * ABUNDANT ENTEROBACTER CLOACAE   Pseudomonas aeruginosa - MIC*    CEFTAZIDIME 4 SENSITIVE Sensitive     CIPROFLOXACIN 0.5 SENSITIVE Sensitive     GENTAMICIN <=1 SENSITIVE Sensitive     IMIPENEM 1 SENSITIVE Sensitive     CEFEPIME 4 SENSITIVE Sensitive     * RARE PSEUDOMONAS AERUGINOSA    Antimicrobials/Microbiology: Anti-infectives (From admission, onward)    Start     Dose/Rate Route Frequency Ordered Stop   05/13/23 1300  ceFEPIme (MAXIPIME) 2 g in sodium chloride 0.9 % 100 mL IVPB        2 g 200 mL/hr over 30 Minutes Intravenous Every 8 hours 05/13/23 1156     05/10/23 1330  piperacillin-tazobactam (ZOSYN) IVPB 3.375 g  Status:  Discontinued        3.375 g 12.5 mL/hr over 240 Minutes Intravenous Every 8 hours 05/10/23 1240 05/13/23 1156   05/06/23 2000  Ampicillin-Sulbactam (UNASYN) 3 g in sodium chloride 0.9 % 100 mL IVPB  Status:  Discontinued        3 g 200 mL/hr over 30 Minutes Intravenous  Every 6 hours 05/06/23 1919 05/10/23 1214         Component Value Date/Time   SDES TRACHEAL ASPIRATE 05/10/2023 1232   SPECREQUEST NONE 05/10/2023 1232   CULT  05/10/2023 1232    ABUNDANT ENTEROBACTER CLOACAE RARE PSEUDOMONAS AERUGINOSA    REPTSTATUS 05/13/2023 FINAL 05/10/2023 1232     Radiology Studies: DG Swallowing Func-Speech Pathology Result Date: 05/17/2023 Table formatting from the original result was not included. Modified Barium Swallow Study Patient Details Name: Piersen Hogenson MRN: [id number] Date of Birth: 1965-07-05 Today's Date: 05/17/2023 HPI/PMH: HPI: Pt is a 57 y.o. 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 CT with type II odontoid fx; 12/19 underwent anterior screw fixation with fluoroscopic guidance and replacement of odontoid screw. Bilateral nasal bone fxs. EEG with severe diffuse encephalopathy. ETT 12/16-21. 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 MR cervical spine 12/18, relevant to swallowing is small prevertebral effusion extending from C1-C5, measuring up to 8 mm. Clinical Impression: Pt presents with a mild oropharyngeal dysphagia per results of MBSS completed today. View of trachea was partially obstructed by pt's shoulder position during the study. No aspiration observed across trials. Oral deficits characterized by reduced oral strength and coordination resulting in posterior spillage of liquids to the valleculae before the swallow. There was  increased oral prep  and transit time with piecemeal deglutition of nectar-thick liquids, pudding, and soft solid trials. There was trace-min oral residue following most trials, which cleared with spontaneous, subsequent swallow. Pharyngeal deficits characterized by reduced base of tongue retraction, and reduced  laryngeal vestibule closure, which resulted in transient, shallow penetration of consecutive sips of thin liquids by cup/straw. There was x1 instance of transient, shallow penetration of thin liquids by sing straw sip, otherwise, small sips were effective for eliminating penetration. Detailed diet recommendations and aspiration precautions outlined below. Education: SLP reviewed MBSS findings and detailed diet recommendations with pt following the study. Used teach back method to review compensatory swallow strategies and aspiration precautions with pt. Good understanding verbalized with all questions pertaining to swallowing answered at this time. Factors that may increase risk of adverse event in presence of aspiration [name] & [name] 2021): Factors that may increase risk of adverse event in presence of aspiration [name] & [name] 2021): Frail or deconditioned; Presence of tubes (ETT, trach, NG, etc.) Recommendations/Plan: Swallowing Evaluation Recommendations Swallowing Evaluation Recommendations Recommendations: PO diet PO Diet Recommendation: Dysphagia 3 (Mechanical soft); Thin liquids (Level 0) Liquid Administration via: Cup; Straw Medication Administration: Whole meds with puree Supervision: Staff to assist with self-feeding; Full supervision/cueing for swallowing strategies Swallowing strategies  : Slow rate; Small bites/sips; Multiple dry swallows after each bite/sip Postural changes: Position pt fully upright for meals Oral care recommendations: Oral care BID (2x/day) Treatment Plan Treatment Plan Treatment recommendations: Therapy as outlined in treatment plan below Follow-up recommendations: -- (TBD) Functional status assessment: Patient has had a recent decline in their functional status and demonstrates the ability to make significant improvements in function in a reasonable and predictable amount of time. Treatment frequency: Min 1x/week Treatment duration: 1 week Interventions: Aspiration  precaution training; Compensatory techniques; Patient/family education; Trials of upgraded texture/liquids; Diet toleration management by SLP Recommendations Recommendations for follow up therapy are one component of a multi-disciplinary discharge planning process, led by the attending physician.  Recommendations may be updated based on patient status, additional functional criteria and insurance authorization. Assessment: Orofacial Exam: Orofacial Exam Oral Cavity - Dentition: Adequate natural dentition Anatomy: Anatomy: WFL; Presence of cervical hardware Boluses Administered: Boluses Administered Boluses Administered: Thin liquids (Level 0); Mildly thick liquids (Level 2, nectar thick); Puree; Solid  Oral Impairment Domain: Oral Impairment Domain Lip Closure: No labial escape Tongue control during bolus hold: Posterior escape of less than half of bolus Bolus preparation/mastication: Timely and efficient chewing and mashing Bolus transport/lingual motion: Delayed initiation of tongue motion (oral holding) Oral residue: Residue collection on oral structures Location of oral residue : Tongue Initiation of pharyngeal swallow : Valleculae  Pharyngeal Impairment Domain: Pharyngeal Impairment Domain Soft palate elevation: No bolus between soft palate (SP)/pharyngeal wall (PW) Laryngeal elevation: Complete superior movement of thyroid cartilage with complete approximation of arytenoids to epiglottic petiole Anterior hyoid excursion: Complete anterior movement Epiglottic movement: Complete inversion Laryngeal vestibule closure: Incomplete, narrow column air/contrast in laryngeal vestibule Pharyngeal stripping wave : Present - complete Pharyngeal contraction (A/P view only): N/A Pharyngoesophageal segment opening: Complete distension and complete duration, no obstruction of flow Tongue base retraction: Trace column of contrast or air between tongue base and PPW Pharyngeal residue: Trace residue within or on pharyngeal  structures Location of pharyngeal residue: Tongue base  Esophageal Impairment Domain: Esophageal Impairment Domain Esophageal clearance upright position: -- (not assessed) Pill: No data recorded Penetration/Aspiration Scale Score: Penetration/Aspiration Scale Score 1.  Material does not enter airway: Thin liquids (Level 0); Mildly thick liquids (Level  2, nectar thick); Puree; Solid 2.  Material enters airway, remains ABOVE vocal cords then ejected out: Thin liquids (Level 0) Compensatory Strategies: Compensatory Strategies Compensatory strategies: Yes Multiple swallows: Effective Effective Multiple Swallows: Solid; Puree; Mildly thick liquid (Level 2, nectar thick); Thin liquid (Level 0)   General Information: Caregiver present: No  Diet Prior to this Study: NPO; Cortrak/Small bore NG tube   Temperature : Normal   Respiratory Status: WFL   Supplemental O2: Nasal cannula (3L)   History of Recent Intubation: Yes  Behavior/Cognition: Alert; Cooperative; Pleasant mood Self-Feeding Abilities: Dependent for feeding Baseline vocal quality/speech: Normal No data recorded Volitional Swallow: Able to elicit Exam Limitations: Limited visibility (due to shoulder positioning) Goal Planning: Prognosis for improved oropharyngeal function: Good No data recorded No data recorded Patient/Family Stated Goal: start PO diet Consulted and agree with results and recommendations: Patient; Nurse Pain: Pain Assessment Pain Assessment: No/denies pain Faces Pain Scale: 0 Facial Expression: 0 Body Movements: 0 Muscle Tension: 0 Compliance with ventilator (intubated pts.): N/A Vocalization (extubated pts.): 0 CPOT Total: 0 End of Session: Start Time:SLP Start Time (ACUTE ONLY): 1355 Stop Time: SLP Stop Time (ACUTE ONLY): 1425 Time Calculation:SLP Time Calculation (min) (ACUTE ONLY): 30 min Charges: SLP Evaluations $ SLP Speech Visit: 1 Visit SLP Evaluations $MBS Swallow: 1 Procedure $Swallowing Treatment: 1 Procedure SLP visit diagnosis: SLP  Visit Diagnosis: Dysphagia, oropharyngeal phase (R13.12) Past Medical History: No past medical history on file. Past Surgical History: Past Surgical History: Procedure Laterality Date  ODONTOID SCREW INSERTION N/A 05/09/2023  Procedure: ODONTOID SCREW FIXATION WITH FLOUROSCOPIC GUIDANCE;  Surgeon: Barnett Abu, MD;  Location: [facility] OR;  Service: Neurosurgery;  Laterality: N/A;  ODONTOID SCREW INSERTION N/A 05/09/2023  Procedure: ODONTOID SCREW INSERTION;  Surgeon: Barnett Abu, MD;  Location: [facility] OR;  Service: Neurosurgery;  Laterality: N/A; Ellery Plunk 05/17/2023, 3:52 PM    LOS: 12 days   Total time spent in review of labs and imaging, patient evaluation, formulation of plan, documentation and communication with family: 50 minutes  Lanae Boast, MD [facility]  05/18/2023, 9:39 AM

## 2023-05-19 DIAGNOSIS — S12100A Unspecified displaced fracture of second cervical vertebra, initial encounter for closed fracture: Secondary | ICD-10-CM

## 2023-05-19 DIAGNOSIS — K922 Gastrointestinal hemorrhage, unspecified: Secondary | ICD-10-CM

## 2023-05-19 DIAGNOSIS — I469 Cardiac arrest, cause unspecified: Secondary | ICD-10-CM | POA: Diagnosis not present

## 2023-05-19 NOTE — Plan of Care (Signed)
°  Problem: Education: Goal: Ability to describe self-care measures that may prevent or decrease complications (Diabetes Survival Skills Education) will improve Outcome: Progressing   Problem: Coping: Goal: Ability to adjust to condition or change in health will improve Outcome: Progressing   Problem: Fluid Volume: Goal: Ability to maintain a balanced intake and output will improve Outcome: Progressing   Problem: Health Behavior/Discharge Planning: Goal: Ability to identify and utilize available resources and services will improve Outcome: Progressing Goal: Ability to manage health-related needs will improve Outcome: Progressing   Problem: Metabolic: Goal: Ability to maintain appropriate glucose levels will improve Outcome: Progressing   Problem: Nutritional: Goal: Maintenance of adequate nutrition will improve Outcome: Progressing Goal: Progress toward achieving an optimal weight will improve Outcome: Progressing   Problem: Skin Integrity: Goal: Risk for impaired skin integrity will decrease Outcome: Progressing   Problem: Education: Goal: Knowledge of General Education information will improve Description: Including pain rating scale, medication(s)/side effects and non-pharmacologic comfort measures Outcome: Progressing   Problem: Health Behavior/Discharge Planning: Goal: Ability to manage health-related needs will improve Outcome: Progressing   Problem: Clinical Measurements: Goal: Ability to maintain clinical measurements within normal limits will improve Outcome: Progressing Goal: Will remain free from infection Outcome: Progressing Goal: Diagnostic test results will improve Outcome: Progressing Goal: Respiratory complications will improve Outcome: Progressing   Problem: Activity: Goal: Risk for activity intolerance will decrease Outcome: Progressing   Problem: Nutrition: Goal: Adequate nutrition will be maintained Outcome: Progressing   Problem:  Coping: Goal: Level of anxiety will decrease Outcome: Progressing   Problem: Elimination: Goal: Will not experience complications related to bowel motility Outcome: Progressing Goal: Will not experience complications related to urinary retention Outcome: Progressing   Problem: Pain Management: Goal: General experience of comfort will improve Outcome: Progressing   Problem: Safety: Goal: Ability to remain free from injury will improve Outcome: Progressing   Problem: Skin Integrity: Goal: Risk for impaired skin integrity will decrease Outcome: Progressing

## 2023-05-19 NOTE — Progress Notes (Signed)
[facility] ROUNDING NOTE   Subjective: No further bleeding since the large episode yesterday morning.  CTA was limited by presence of barium in the colon from the barium swallow earlier, but did not show any evidence of active hemorrhage.  His hemoglobin was unchanged today compared to yesterday.  No other new complaints or concerns from the patient this morning.   Objective: Vital signs in last 24 hours: Temp:  [98.4 F (36.9 C)-98.9 F (37.2 C)] 98.9 F (37.2 C) (12/29 1151) Pulse Rate:  [93-107] 107 (12/29 1151) Resp:  [16-20] 20 (12/29 1151) BP: (138-158)/(78-96) 155/96 (12/29 1151) SpO2:  [93 %-100 %] 99 % (12/29 1151) Weight:  [124.9 kg] 124.9 kg (12/29 0438) Last BM Date : 05/18/23 General: NAD, pleasant obese African-American male lying in bed Lungs:  CTA b/l, no w/r/r Heart:  RRR, no m/r/g Abdomen:  Soft, NT, ND, +BS     Intake/Output from previous day: 12/28 0701 - 12/29 0700 In: 240 [P.O.:240] Out: 1375 [Urine:1375] Intake/Output this shift: No intake/output data recorded.   Lab Results: Recent Labs    05/18/23 0352 05/18/23 0910 05/18/23 1330 05/18/23 1954 05/19/23 0316  WBC 16.7*  --   --   --  18.6*  HGB 13.0   < > 12.2* 12.5* 12.2*  PLT 561*  --   --   --  527*  MCV 90.1  --   --   --  89.1   < > = values in this interval not displayed.   BMET Recent Labs    05/18/23 0352 05/19/23 0316  NA 140 136  K 4.1 4.0  CL 103 101  CO2 27 27  GLUCOSE 218* 235*  BUN 13 19  CREATININE 0.91 0.86  CALCIUM 9.5 9.3   LFT No results for input(s): "PROT", "ALBUMIN", "AST", "ALT", "ALKPHOS", "BILITOT", "BILIDIR", "IBILI" in the last 72 hours. PT/INR No results for input(s): "INR" in the last 72 hours.    Imaging/Other results: CT ANGIO GI BLEED Result Date: 05/18/2023 CLINICAL DATA:  Rectal bleeding EXAM: CTA ABDOMEN AND PELVIS WITHOUT AND WITH CONTRAST TECHNIQUE: Multidetector CT imaging of the abdomen and pelvis was performed using  the standard protocol during bolus administration of intravenous contrast. Multiplanar reconstructed images and MIPs were obtained and reviewed to evaluate the vascular anatomy. RADIATION DOSE REDUCTION: This exam was performed according to the departmental dose-optimization program which includes automated exposure control, adjustment of the mA and/or kV according to patient size and/or use of iterative reconstruction technique. CONTRAST:  75mL OMNIPAQUE IOHEXOL 350 MG/ML SOLN COMPARISON:  05/17/2023, 05/06/2023 FINDINGS: This evaluation is severely limited due to suboptimal timing of the contrast bolus and retained barium throughout the colon. VASCULAR Aorta: Normal caliber aorta without aneurysm, dissection, vasculitis or significant stenosis. Mild atherosclerosis. Celiac: Patent without evidence of aneurysm, dissection, vasculitis or significant stenosis. SMA: Patent without evidence of aneurysm, dissection, vasculitis or significant stenosis. Renals: Both renal arteries are patent without evidence of aneurysm, dissection, vasculitis, fibromuscular dysplasia or significant stenosis. IMA: Patent without evidence of aneurysm, dissection, vasculitis or significant stenosis. Inflow: Patent without evidence of aneurysm, dissection, vasculitis or significant stenosis. Proximal Outflow: Bilateral common femoral and visualized portions of the superficial and profunda femoral arteries are patent without evidence of aneurysm, dissection, vasculitis or significant stenosis. Mild atherosclerosis. Veins: IVC filter identified. 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 There are numerous venous collaterals throughout the pelvis and abdominal wall. Review of the MIP images confirms the above findings. NON-VASCULAR  Lower chest: Scattered hypoventilatory changes. No acute pleural or parenchymal lung disease. Hepatobiliary: No focal liver abnormality is seen. No gallstones,  gallbladder wall thickening, or biliary dilatation. Pancreas: Unremarkable. No pancreatic ductal dilatation or surrounding inflammatory changes. Spleen: Multiple splenules within the left upper quadrant again noted. Adrenals/Urinary Tract: Adrenal glands are unremarkable. Kidneys are normal, without renal calculi, focal lesion, or hydronephrosis. Bladder is decompressed with a Foley catheter. Stomach/Bowel: The evaluation for active gastrointestinal bleeding is limited due to timing of contrast bolus and retained barium throughout the ascending, transverse, and descending colon. There is no evidence of active gastrointestinal bleeding within the rectosigmoid colon, stomach, or small bowel. No bowel obstruction or ileus. Normal appendix right lower quadrant. No bowel wall thickening or inflammatory change. Enteric catheter tip within the region of the gastric pylorus. Lymphatic: No pathologic adenopathy within the abdomen or pelvis. Reproductive: Prostate is unremarkable. Other: No free fluid or free intraperitoneal gas. No abdominal wall hernia. Musculoskeletal: No acute or destructive bony abnormalities. Reconstructed images demonstrate no additional findings. IMPRESSION: VASCULAR 1. Nondiagnostic evaluation for active gastrointestinal bleeding within the ascending, transverse, and descending colon due to retained barium. 2. No evidence of active gastrointestinal bleeding within the stomach, small bowel, or rectosigmoid colon. Evaluation in these regions is limited due to suboptimal timing of the contrast bolus. 3.  Aortic Atherosclerosis (ICD10-I70.0). 4. 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 NON-VASCULAR 1. No acute intra-abdominal or intrapelvic process. Electronically Signed   By: Sharlet Salina M.D.   On: 05/18/2023 15:42   DG Swallowing Func-Speech Pathology Result  Date: 05/17/2023 Table formatting from the original result was not included. Modified Barium Swallow Study Patient Details Name: Glenn Henderson Date of Birth: 11-16-1965 Today's Date: 05/17/2023 HPI/PMH: HPI: Pt is a 57 y.o. 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 CT with type II odontoid fx; 12/19 underwent anterior screw fixation with fluoroscopic guidance and replacement of odontoid screw. Bilateral nasal bone fxs. EEG with severe diffuse encephalopathy. ETT 12/16-21. 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 MR cervical spine 12/18, relevant to swallowing is small prevertebral effusion extending from C1-C5, measuring up to 8 mm. Clinical Impression: Pt presents with a mild oropharyngeal dysphagia per results of MBSS completed today. View of trachea was partially obstructed by pt's shoulder position during the study. No aspiration observed across trials. Oral deficits characterized by reduced oral strength and coordination resulting in posterior spillage of liquids to the valleculae before the swallow. There was  increased oral prep and transit time with piecemeal deglutition of nectar-thick liquids, pudding, and soft solid trials. There was trace-min oral residue following most trials, which cleared with spontaneous, subsequent swallow. Pharyngeal deficits characterized by reduced base of tongue retraction, and reduced laryngeal vestibule closure, which resulted in transient, shallow penetration of consecutive sips of thin liquids by cup/straw. There was x1 instance of transient, shallow penetration of thin liquids by sing straw sip, otherwise, small sips were effective for eliminating penetration. Detailed diet recommendations and aspiration precautions outlined below. Education: SLP  reviewed MBSS findings and detailed diet recommendations with pt following the study. Used teach back method to review compensatory swallow strategies and aspiration precautions with pt. Good understanding  verbalized with all questions pertaining to swallowing answered at this time. Factors that may increase risk of adverse event in presence of aspiration [name] & [name] 2021): Factors that may increase risk of adverse event in presence of aspiration [name] & [name] 2021): Frail or deconditioned; Presence of tubes (ETT, trach, NG, etc.) Recommendations/Plan: Swallowing Evaluation Recommendations Swallowing Evaluation Recommendations Recommendations: PO diet PO Diet Recommendation: Dysphagia 3 (Mechanical soft); Thin liquids (Level 0) Liquid Administration via: Cup; Straw Medication Administration: Whole meds with puree Supervision: Staff to assist with self-feeding; Full supervision/cueing for swallowing strategies Swallowing strategies  : Slow rate; Small bites/sips; Multiple dry swallows after each bite/sip Postural changes: Position pt fully upright for meals Oral care recommendations: Oral care BID (2x/day) Treatment Plan Treatment Plan Treatment recommendations: Therapy as outlined in treatment plan below Follow-up recommendations: -- (TBD) Functional status assessment: Patient has had a recent decline in their functional status and demonstrates the ability to make significant improvements in function in a reasonable and predictable amount of time. Treatment frequency: Min 1x/week Treatment duration: 1 week Interventions: Aspiration precaution training; Compensatory techniques; Patient/family education; Trials of upgraded texture/liquids; Diet toleration management by SLP Recommendations Recommendations for follow up therapy are one component of a multi-disciplinary discharge planning process, led by the attending physician.  Recommendations may be updated based on patient status, additional functional  criteria and insurance authorization. Assessment: Orofacial Exam: Orofacial Exam Oral Cavity - Dentition: Adequate natural dentition Anatomy: Anatomy: WFL; Presence of cervical hardware Boluses Administered: Boluses Administered Boluses Administered: Thin liquids (Level 0); Mildly thick liquids (Level 2, nectar thick); Puree; Solid  Oral Impairment Domain: Oral Impairment Domain Lip Closure: No labial escape Tongue control during bolus hold: Posterior escape of less than half of bolus Bolus preparation/mastication: Timely and efficient chewing and mashing Bolus transport/lingual motion: Delayed initiation of tongue motion (oral holding) Oral residue: Residue collection on oral structures Location of oral residue : Tongue Initiation of pharyngeal swallow : Valleculae  Pharyngeal Impairment Domain: Pharyngeal Impairment Domain Soft palate elevation: No bolus between soft palate (SP)/pharyngeal wall (PW) Laryngeal elevation: Complete superior movement of thyroid cartilage with complete approximation of arytenoids to epiglottic petiole Anterior hyoid excursion: Complete anterior movement Epiglottic movement: Complete inversion Laryngeal vestibule closure: Incomplete, narrow column air/contrast in laryngeal vestibule Pharyngeal stripping wave : Present - complete Pharyngeal contraction (A/P view only): N/A Pharyngoesophageal segment opening: Complete distension and complete duration, no obstruction of flow Tongue base retraction: Trace column of contrast or air between tongue base and PPW Pharyngeal residue: Trace residue within or on pharyngeal structures Location of pharyngeal residue: Tongue base  Esophageal Impairment Domain: Esophageal Impairment Domain Esophageal clearance upright position: -- (not assessed) Pill: No data recorded Penetration/Aspiration Scale Score: Penetration/Aspiration Scale Score 1.  Material does not enter airway: Thin liquids (Level 0); Mildly thick liquids (Level 2, nectar thick); Puree;  Solid 2.  Material enters airway, remains ABOVE vocal cords then ejected out: Thin liquids (Level 0) Compensatory Strategies: Compensatory Strategies Compensatory strategies: Yes Multiple swallows: Effective Effective Multiple Swallows: Solid; Puree; Mildly thick liquid (Level 2, nectar thick); Thin liquid (Level 0)   General Information: Caregiver present: No  Diet Prior to this Study: NPO; Cortrak/Small bore NG tube   Temperature : Normal   Respiratory Status: WFL   Supplemental O2: Nasal cannula (3L)   History of Recent Intubation: Yes  Behavior/Cognition: Alert; Cooperative; Pleasant mood Self-Feeding Abilities: Dependent for feeding Baseline vocal quality/speech: Normal No data recorded Volitional Swallow: Able to elicit Exam Limitations: Limited visibility (due to  shoulder positioning) Goal Planning: Prognosis for improved oropharyngeal function: Good No data recorded No data recorded Patient/Family Stated Goal: start PO diet Consulted and agree with results and recommendations: Patient; Nurse Pain: Pain Assessment Pain Assessment: No/denies pain Faces Pain Scale: 0 Facial Expression: 0 Body Movements: 0 Muscle Tension: 0 Compliance with ventilator (intubated pts.): N/A Vocalization (extubated pts.): 0 CPOT Total: 0 End of Session: Start Time:SLP Start Time (ACUTE ONLY): 1355 Stop Time: SLP Stop Time (ACUTE ONLY): 1425 Time Calculation:SLP Time Calculation (min) (ACUTE ONLY): 30 min Charges: SLP Evaluations $ SLP Speech Visit: 1 Visit SLP Evaluations $MBS Swallow: 1 Procedure $Swallowing Treatment: 1 Procedure SLP visit diagnosis: SLP Visit Diagnosis: Dysphagia, oropharyngeal phase (R13.12) Past Medical History: No past medical history on file. Past Surgical History: Past Surgical History: Procedure Laterality Date  ODONTOID SCREW INSERTION N/A 05/09/2023  Procedure: ODONTOID SCREW FIXATION WITH FLOUROSCOPIC GUIDANCE;  Surgeon: Barnett Abu, MD;  Location: [facility] OR;  Service: Neurosurgery;  Laterality: N/A;   ODONTOID SCREW INSERTION N/A 05/09/2023  Procedure: ODONTOID SCREW INSERTION;  Surgeon: Barnett Abu, MD;  Location: [facility];  Service: Neurosurgery;  Laterality: N/A; Ellery Plunk 05/17/2023, 3:52 PM     Assessment and Plan:  57 year old male with history of DVT/PE, obesity, sleep apnea, diabetes, chronic pain, depression/PTSD, admitted following PEA arrest of unknown clear etiology, complicated by C1 fracture repaired with odontoid screw, recovering and progressing towards rehab, but who experienced a large volume maroon-colored bowel movement yesterday morning with associated lightheadedness.  No further bleeding since then, and CTA negative for active bleeding.  Hemoglobin stable. Suspect patient had self-limited diverticular bleed.  Would not pursue colonoscopy at this time given cessation of bleeding, and patient's current limited mobility and recent cervical fracture/cardiac arrest Would recommend patient undergo outpatient colonoscopy at some point when he has more fully recovered from his injuries/illnesses.  Hematochezia, self-limited, suspect resolved diverticular bleed - No further bleeding since yesterday morning. - Negative CTA - Stable hemoglobin - Trend hemoglobin daily - Okay to advance diet from GI standpoint - Consider outpatient colonoscopy when fully recovered  GI will sign off at this time.  Please reconsult if patient experiences recurrent bleeding.     Jenel Lucks, MD  05/19/2023, 11:56 AM [facility]

## 2023-05-19 NOTE — Progress Notes (Signed)
Speech Language Pathology Treatment: Dysphagia  Patient Details Name: Glenn Henderson MRN: [id number] DOB: 1965/12/09 Today's Date: 05/19/2023 Time: 1540-1600 SLP Time Calculation (min) (ACUTE ONLY): 20 min  Assessment / Plan / Recommendation Clinical Impression  Patient seen by SLP for skilled treatment session focused on dysphagia goals. Patient told SLP that he was hopeful of getting Cortrak feeding tube out as it is causing him some discomfort in his throat. RN reported that patient has been eating well and no observed difficulties with PO intake. Patient's dinner meal tray was in the room and he was receptive to having some. SLP fed him several bites of [place] (mechanical soft) and mashed potatoes as well as sips of thin liquids (milk). He did exhibit mildly prolonged mastication but no overt s/s aspiration observed during or after PO intake. Patient did require some cues for attention as he seemed tangential, asking SLP if there was a chance that some "electronics" in the barbershop caused him to pass out as he doesn't understand how it happened so suddenly. SLP encouraged patient to discuss his concerns and ask questions from nursing staff and medical staff. SLP will continue to follow for PO toleration.   HPI HPI: Pt is a 57 y.o. 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 CT with type II odontoid fx; 12/19 underwent anterior screw fixation with fluoroscopic guidance and replacement of odontoid screw. Bilateral nasal bone fxs. EEG with severe diffuse encephalopathy. ETT 12/16-21. 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 MR cervical spine 12/18, relevant to swallowing is small prevertebral effusion extending from C1-C5, measuring up to 8 mm.      SLP Plan  Continue with current plan of  care      Recommendations for follow up therapy are one component of a multi-disciplinary discharge planning process, led by the attending physician.  Recommendations may be updated based on patient status, additional functional criteria and insurance authorization.    Recommendations  Diet recommendations: Dysphagia 3 (mechanical soft);Thin liquid Medication Administration: Whole meds with puree Supervision: Full supervision/cueing for compensatory strategies;Staff to assist with self feeding Compensations: Slow rate;Small sips/bites Postural Changes and/or Swallow Maneuvers: Seated upright 90 degrees                  Oral care BID;Staff/trained caregiver to provide oral care   Intermittent Supervision/Assistance Dysphagia, oropharyngeal phase (R13.12)     Continue with current plan of care     Angela Nevin, MA, CCC-SLP Speech Therapy

## 2023-05-19 NOTE — Progress Notes (Addendum)
PROGRESS NOTE Glenn Henderson  UXN:[id number] DOB: [birth year] DOA: 05/06/2023 PCP: Fleet Contras, MD  Brief Narrative/Hospital Course: Glenn Henderson is a 57 y.o. male with a history of hyperlipidemia, OSA, hepatitis, major depressive disorder, anxiety, PTSD, tobacco use, diabetes mellitus type 2, peripheral neuropathy, chronic radicular lumbar pain with chronic pain syndrome.  Patient presented secondary to out of hospital PEA arrest requiring 2 rounds of CPR prior to ROSC.  Patient intubated and admitted to the ICU for management of respiratory failure.  Patient also suffered a cervical spine fracture involving C1 requiring odontoid screw fixation by neurosurgery.  Hospitalization further complicated by multifocal pneumonia, pain management, delirium, suicidal ideation.At this point patient is for acute inpatient rehabilitation pending improvement in vitals and pain tolerance.   Subjective: Seen and examined Co ongoing pain on neck, back ansking to increase his pain meds Moving extremities well painful on left arm Overnight patient is afebrile blood sugar remains elevated, CBC WBC up  16>18.6, hb abt the same at 12 He had CT angio bleed yesterday nondiagnostic evaluation for active GI bleeding in the ascending transverse and descending colon due to retained barium however No evidence of active GI bleeding in the stomach small bowel or rectosigmoid, IVC filter in place with evidence of chronic thrombosis distally  Assessment and Plan: Principal Problem:   Cardiac arrest [facility]) Active Problems:   Closed fracture of nasal bones   Delirium due to another medical condition  S/p PEA cardiac arrest out of hospital: Unclear etiology. EEG without evidence of seizures. URK:YHCWCB RV, LVEF of 60-65% and no regional wall motion abnormality.  Currently doing well. He is medically stable   Acute respiratory failure with hypoxia In the setting of cardiac arrest needing mechanical ventilation until 12/21.  Doing  well on 4L [place], resp status is stable  Blood clot/rectal bleeding: Hemoglobin and vitals stable, anticoagulation from home has been on hold will consulted GI-discussed with Dr. Tomasa Rand will trend H/H, add PPI twice daily.GI advised CTA angio bleed- pt is agreeable.HOLD LOVENOX. Recent Labs  Lab 05/18/23 0352 05/18/23 0910 05/18/23 1330 05/18/23 1954 05/19/23 0316  HGB 13.0 12.7* 12.2* 12.5* 12.2*  HCT 41.0 38.4* 38.8* 38.2* 37.7*    Multifocal pneumonia CT chest>bilateral lower lobe infiltrates consistent with pneumonia .  Tracheal culture showed Enterobacter cloacae and Pseudomonas and will continue cefepime.he is afebrile.  Labs with leukocytosis. Cont to monitor  Low TSH Thyroid levels consistent with hyperthyroidism with low TSH, elevated free T4. Total T3 is normal, however. Patient started on metoprolol for cardiac effects and appears to be overall stable.  Will need endocrinology follow-up.   Delirium Anxiety Depression Mood remains stable on home Klonopin.  Delirium likely in the setting of multiple meds,prolonged hospitalization.  Mentation is stable and improved.   Unstable C-spine C1-C2 fracture: Secondary to fall outside the hospital.  Seen by Neurosurgery with odontoid screw fixation performed on 12/19 with subsequent need for replacement of odontoid screw. Continue soft cervical collar, plan for rehab.  Continue pain control   Nasal bone fractures Noted incidentally on CT imaging. Secondary to fall with head trauma. ENT with recommendations for no intervention.   Suicidal ideation Patient expressed suicidal ideation on 12/22-seen by psychiatry:IVC, Effexor, Seroquel, tapering Klonopin.  Seen again 12/26 advised one-to-one until discharge however overall low acute risk for suicide and has consistently denied in the last 3 days.   History of CVA History of TBI Previous left-sided ablation  the same.   History of DVT/PE Patient is on Eliquis as  an outpatient which  was held on admission. Neurosurgery cleared patient for resumption of DVT prophylaxis but will need to clarify if able to restart full dose anticoagulation soon. Cont to hold due to GI bleeding. Discussed with Dr Tomasa Rand, ok to resume eliquis Tuesday, messaged Dr Danielle Dess if okay to resume it.   Elevated AST/ALT Unclear etiology. No obvious symptoms. Patient does have an elevated temperature, but no pain located in RUQ. [place] this admission significant for evidence of hepatic steatosis and hepatocellular disease. Lipitor held. CK normal.  Labs overall better monitor intermittently  Recent Labs  Lab 05/14/23 0805 05/15/23 0425  AST 86* 46*  ALT 150* 113*  ALKPHOS 118 102  BILITOT 0.5 0.6  PROT 7.3 7.1  ALBUMIN 2.6* 2.5*     Diabetes mellitus type 2 with uncontrolled hyperglycemia: A1c at 10, blood sugar poorly controlled, increase semglee and cont SSI  Recent Labs  Lab 05/18/23 2037 05/19/23 0035 05/19/23 0430 05/19/23 0714 05/19/23 1148  GLUCAP 234* 221* 220* 230* 223*    Peripheral neuropathy Stable on home Neurontin.  Also on Cymbalta at home held by psychiatry   Chronic pain On oxycodone PTA by Dr. Tollie Eth in [place], [place]. Per PDMP review, patient has not been filling reported prescription of oxycodone 30 mg 6 times daily. Unclear of validity. Oxycodone increased to 5-10 mg q 4hours.  Patient somewhat upset that he did not get his oxycodone while he was in sleep-clinic to him that meds are as needed we discussed about potential downsides of being on narcotics, will not increase dose further  Primary hypertension BP is well-controlled.  Continue home clonidine twice daily, metoprolol  Dysphagia Patient failed swallow study postextubation. Speech therapy reevaluated , plan for MBS  at somepoint Continue NG tube feeding for now  Class I Obesity: Patient's Body mass index is 35.35 kg/m. : Will benefit with PCP follow-up, weight loss  healthy lifestyle and outpatient sleep  evaluation.  DVT prophylaxis: Place and maintain sequential compression device Start: 05/18/23 1347 Place and maintain sequential compression device Start: 05/18/23 1346 SCDs Start: 05/06/23 1819 Code Status:   Code Status: Full Code Family Communication: plan of care discussed with patient at bedside. Patient status is: Remains hospitalized because of severity of illness Level of care: Progressive   Dispo: The patient is from: HOME            Anticipated disposition: TBD Objective: Vitals last 24 hrs: Vitals:   05/18/23 2041 05/19/23 0438 05/19/23 0716 05/19/23 1151  BP: (!) 142/78 (!) 158/91 138/78 (!) 155/96  Pulse: 98 93 95 (!) 107  Resp: 19 16 17 20   Temp: 98.9 F (37.2 C) 98.4 F (36.9 C) 98.6 F (37 C) 98.9 F (37.2 C)  TempSrc: Oral Oral Oral Oral  SpO2: 96% 93% 100% 99%  Weight:  124.9 kg    Height:       Weight change: -1.7 kg  Physical Examination: General exam: alert awake, oriented HEENT:Oral mucosa moist, Ear/Nose WNL grossly,cervical collar+ Respiratory system: Bilaterally clear BS,no use of accessory muscle Cardiovascular system: S1 & S2 +, No JVD. Gastrointestinal system: Abdomen soft,NT,ND, BS+ Nervous System: Alert, awake, moving all extremities Extremities: LE edema neg,distal peripheral pulses palpable and warm.  Skin: No rashes,no icterus. MSK: Normal muscle bulk,tone, power   Medications reviewed:  Scheduled Meds:  Chlorhexidine Gluconate Cloth  6 each Topical Daily   clonazepam  0.25 mg Oral TID   cloNIDine  0.1 mg Oral BID   docusate  100 mg Oral  BID   famotidine  20 mg Oral BID   feeding supplement  237 mL Oral BID BM   feeding supplement (PROSource TF20)  60 mL Per Tube BID   [START ON 05/20/2023] folic acid  1 mg Oral Daily   free water  200 mL Per Tube Q4H   gabapentin  300 mg Oral Q8H   influenza vac split trivalent PF  0.5 mL Intramuscular Tomorrow-1000   insulin aspart  0-20 Units Subcutaneous Q4H   insulin aspart  4 Units  Subcutaneous Q4H   insulin glargine-yfgn  27 Units Subcutaneous BID   metoprolol tartrate  25 mg Oral BID   [START ON 05/20/2023] multivitamin with minerals  1 tablet Oral Daily   mouth rinse  15 mL Mouth Rinse 4 times per day   pantoprazole  40 mg Oral BID   polyethylene glycol  17 g Oral BID   QUEtiapine  50 mg Oral BID   thiamine  100 mg Oral Daily   venlafaxine XR  150 mg Oral Q breakfast   Continuous Infusions:  ceFEPime (MAXIPIME) IV 2 g (05/19/23 0430)   feeding supplement (VITAL 1.5 CAL) 1,000 mL (05/19/23 0053)      Diet Order             DIET DYS 3 Room service appropriate? Yes; Fluid consistency: Thin  Diet effective now                 Nutrition Problem: Inadequate oral intake Etiology: acute illness Signs/Symptoms: NPO status Interventions: Refer to RD note for recommendations, Tube feeding   Intake/Output Summary (Last 24 hours) at 05/19/2023 1200 Last data filed at 05/19/2023 0441 Gross per 24 hour  Intake 240 ml  Output 1375 ml  Net -1135 ml   Net IO Since Admission: -12,171.14 mL [05/19/23 1200]  Wt Readings from Last 3 Encounters:  05/19/23 124.9 kg     Unresulted Labs (From admission, onward)    None     Data Reviewed: I have personally reviewed following labs and imaging studies CBC: Recent Labs  Lab 05/13/23 0334 05/14/23 0805 05/18/23 0352 05/18/23 0910 05/18/23 1330 05/18/23 1954 05/19/23 0316  WBC 13.7* 12.1* 16.7*  --   --   --  18.6*  NEUTROABS  --  7.4  --   --   --   --   --   HGB 12.1* 11.9* 13.0 12.7* 12.2* 12.5* 12.2*  HCT 37.6* 36.7* 41.0 38.4* 38.8* 38.2* 37.7*  MCV 89.1 87.2 90.1  --   --   --  89.1  PLT 351 401* 561*  --   --   --  527*   Basic Metabolic Panel:  Recent Labs  Lab 05/13/23 0334 05/14/23 0805 05/15/23 0425 05/18/23 0352 05/19/23 0316  NA 142 138 138 140 136  K 3.6 3.6 3.7 4.1 4.0  CL 109 108 106 103 101  CO2 22 21* 25 27 27   GLUCOSE 126* 237* 251* 218* 235*  BUN 15 17 14 13 19    CREATININE 0.97 0.88 0.86 0.91 0.86  CALCIUM 8.6* 8.7* 8.9 9.5 9.3  MG 2.0  --   --   --   --   PHOS 2.7  --   --   --   --    GFR: Estimated Creatinine Clearance: 134.7 mL/min (by C-G formula based on SCr of 0.86 mg/dL). Liver Function Tests:  Recent Labs  Lab 05/14/23 0805 05/15/23 0425  AST 86* 46*  ALT 150* 113*  ALKPHOS 118 102  BILITOT 0.5 0.6  PROT 7.3 7.1  ALBUMIN 2.6* 2.5*   Recent Results (from the past 240 hours)  Culture, Respiratory w Gram Stain     Status: None   Collection Time: 05/10/23 12:32 PM   Specimen: Tracheal Aspirate; Respiratory  Result Value Ref Range Status   Specimen Description TRACHEAL ASPIRATE  Final   Special Requests NONE  Final   Gram Stain   Final    ABUNDANT WBC PRESENT, PREDOMINANTLY PMN RARE GRAM NEGATIVE RODS RARE BUDDING YEAST SEEN Performed at [facility], [address] [place], [place] [address]    Culture   Final    ABUNDANT ENTEROBACTER CLOACAE RARE PSEUDOMONAS AERUGINOSA    Report Status 05/13/2023 FINAL  Final   Organism ID, Bacteria ENTEROBACTER CLOACAE  Final   Organism ID, Bacteria PSEUDOMONAS AERUGINOSA  Final      Susceptibility   Enterobacter cloacae - MIC*    CEFEPIME <=0.12 SENSITIVE Sensitive     CEFTAZIDIME <=1 SENSITIVE Sensitive     CIPROFLOXACIN <=0.25 SENSITIVE Sensitive     GENTAMICIN <=1 SENSITIVE Sensitive     IMIPENEM <=0.25 SENSITIVE Sensitive     TRIMETH/SULFA <=20 SENSITIVE Sensitive     PIP/TAZO <=4 SENSITIVE Sensitive ug/mL    * ABUNDANT ENTEROBACTER CLOACAE   Pseudomonas aeruginosa - MIC*    CEFTAZIDIME 4 SENSITIVE Sensitive     CIPROFLOXACIN 0.5 SENSITIVE Sensitive     GENTAMICIN <=1 SENSITIVE Sensitive     IMIPENEM 1 SENSITIVE Sensitive     CEFEPIME 4 SENSITIVE Sensitive     * RARE PSEUDOMONAS AERUGINOSA    Antimicrobials/Microbiology: Anti-infectives (From admission, onward)    Start     Dose/Rate Route Frequency Ordered Stop   05/13/23 1300  ceFEPIme (MAXIPIME) 2 g in sodium  chloride 0.9 % 100 mL IVPB        2 g 200 mL/hr over 30 Minutes Intravenous Every 8 hours 05/13/23 1156     05/10/23 1330  piperacillin-tazobactam (ZOSYN) IVPB 3.375 g  Status:  Discontinued        3.375 g 12.5 mL/hr over 240 Minutes Intravenous Every 8 hours 05/10/23 1240 05/13/23 1156   05/06/23 2000  Ampicillin-Sulbactam (UNASYN) 3 g in sodium chloride 0.9 % 100 mL IVPB  Status:  Discontinued        3 g 200 mL/hr over 30 Minutes Intravenous Every 6 hours 05/06/23 1919 05/10/23 1214         Component Value Date/Time   SDES TRACHEAL ASPIRATE 05/10/2023 1232   SPECREQUEST NONE 05/10/2023 1232   CULT  05/10/2023 1232    ABUNDANT ENTEROBACTER CLOACAE RARE PSEUDOMONAS AERUGINOSA    REPTSTATUS 05/13/2023 FINAL 05/10/2023 1232     Radiology Studies: CT ANGIO GI BLEED Result Date: 05/18/2023 CLINICAL DATA:  Rectal bleeding EXAM: CTA ABDOMEN AND PELVIS WITHOUT AND WITH CONTRAST TECHNIQUE: Multidetector CT imaging of the abdomen and pelvis was performed using the standard protocol during bolus administration of intravenous contrast. Multiplanar reconstructed images and MIPs were obtained and reviewed to evaluate the vascular anatomy. RADIATION DOSE REDUCTION: This exam was performed according to the departmental dose-optimization program which includes automated exposure control, adjustment of the mA and/or kV according to patient size and/or use of iterative reconstruction technique. CONTRAST:  75mL OMNIPAQUE IOHEXOL 350 MG/ML SOLN COMPARISON:  05/17/2023, 05/06/2023 FINDINGS: This evaluation is severely limited due to suboptimal timing of the contrast bolus and retained barium throughout the colon. VASCULAR Aorta: Normal caliber aorta without aneurysm, dissection,  vasculitis or significant stenosis. Mild atherosclerosis. Celiac: Patent without evidence of aneurysm, dissection, vasculitis or significant stenosis. SMA: Patent without evidence of aneurysm, dissection, vasculitis or significant  stenosis. Renals: Both renal arteries are patent without evidence of aneurysm, dissection, vasculitis, fibromuscular dysplasia or significant stenosis. IMA: Patent without evidence of aneurysm, dissection, vasculitis or significant stenosis. Inflow: Patent without evidence of aneurysm, dissection, vasculitis or significant stenosis. Proximal Outflow: Bilateral common femoral and visualized portions of the superficial and profunda femoral arteries are patent without evidence of aneurysm, dissection, vasculitis or significant stenosis. Mild atherosclerosis. Veins: IVC filter identified. 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 There are numerous venous collaterals throughout the pelvis and abdominal wall. Review of the MIP images confirms the above findings. NON-VASCULAR Lower chest: Scattered hypoventilatory changes. No acute pleural or parenchymal lung disease. Hepatobiliary: No focal liver abnormality is seen. No gallstones, gallbladder wall thickening, or biliary dilatation. Pancreas: Unremarkable. No pancreatic ductal dilatation or surrounding inflammatory changes. Spleen: Multiple splenules within the left upper quadrant again noted. Adrenals/Urinary Tract: Adrenal glands are unremarkable. Kidneys are normal, without renal calculi, focal lesion, or hydronephrosis. Bladder is decompressed with a Foley catheter. Stomach/Bowel: The evaluation for active gastrointestinal bleeding is limited due to timing of contrast bolus and retained barium throughout the ascending, transverse, and descending colon. There is no evidence of active gastrointestinal bleeding within the rectosigmoid colon, stomach, or small bowel. No bowel obstruction or ileus. Normal appendix right lower quadrant. No bowel wall thickening or inflammatory change. Enteric catheter tip within the region of the gastric pylorus. Lymphatic: No pathologic adenopathy within the abdomen or pelvis.  Reproductive: Prostate is unremarkable. Other: No free fluid or free intraperitoneal gas. No abdominal wall hernia. Musculoskeletal: No acute or destructive bony abnormalities. Reconstructed images demonstrate no additional findings. IMPRESSION: VASCULAR 1. Nondiagnostic evaluation for active gastrointestinal bleeding within the ascending, transverse, and descending colon due to retained barium. 2. No evidence of active gastrointestinal bleeding within the stomach, small bowel, or rectosigmoid colon. Evaluation in these regions is limited due to suboptimal timing of the contrast bolus. 3.  Aortic Atherosclerosis (ICD10-I70.0). 4. 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 NON-VASCULAR 1. No acute intra-abdominal or intrapelvic process. Electronically Signed   By: Sharlet Salina M.D.   On: 05/18/2023 15:42   DG Swallowing Func-Speech Pathology Result Date: 05/17/2023 Table formatting from the original result was not included. Modified Barium Swallow Study Patient Details Name: Dandre Rabel MRN: [id number] Date of Birth: 04/12/1966 Today's Date: 05/17/2023 HPI/PMH: HPI: Pt is a 57 y.o. 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 CT with type II odontoid fx; 12/19 underwent anterior screw fixation with fluoroscopic guidance and replacement of odontoid screw. Bilateral nasal bone fxs. EEG with severe diffuse encephalopathy. ETT 12/16-21. 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 MR cervical spine 12/18, relevant to swallowing is small prevertebral effusion extending from C1-C5, measuring up to 8 mm. Clinical Impression: Pt presents with a mild oropharyngeal dysphagia per  results of MBSS completed today. View of trachea was partially obstructed by pt's shoulder position during the study. No aspiration observed across trials. Oral deficits characterized by reduced oral strength and coordination resulting in posterior  spillage of liquids to the valleculae before the swallow. There was  increased oral prep and transit time with piecemeal deglutition of nectar-thick liquids, pudding, and soft solid trials. There was trace-min oral residue following most trials, which cleared with spontaneous, subsequent swallow. Pharyngeal deficits characterized by reduced base of tongue retraction, and reduced laryngeal vestibule closure, which resulted in transient, shallow penetration of consecutive sips of thin liquids by cup/straw. There was x1 instance of transient, shallow penetration of thin liquids by sing straw sip, otherwise, small sips were effective for eliminating penetration. Detailed diet recommendations and aspiration precautions outlined below. Education: SLP reviewed MBSS findings and detailed diet recommendations with pt following the study. Used teach back method to review compensatory swallow strategies and aspiration precautions with pt. Good understanding verbalized with all questions pertaining to swallowing answered at this time. Factors that may increase risk of adverse event in presence of aspiration [name] & [name] 2021): Factors that may increase risk of adverse event in presence of aspiration [name] & [name] 2021): Frail or deconditioned; Presence of tubes (ETT, trach, NG, etc.) Recommendations/Plan: Swallowing Evaluation Recommendations Swallowing Evaluation Recommendations Recommendations: PO diet PO Diet Recommendation: Dysphagia 3 (Mechanical soft); Thin liquids (Level 0) Liquid Administration via: Cup; Straw Medication Administration: Whole meds with puree Supervision: Staff to assist with self-feeding; Full supervision/cueing for swallowing strategies Swallowing  strategies  : Slow rate; Small bites/sips; Multiple dry swallows after each bite/sip Postural changes: Position pt fully upright for meals Oral care recommendations: Oral care BID (2x/day) Treatment Plan Treatment Plan Treatment recommendations: Therapy as outlined in treatment plan below Follow-up recommendations: -- (TBD) Functional status assessment: Patient has had a recent decline in their functional status and demonstrates the ability to make significant improvements in function in a reasonable and predictable amount of time. Treatment frequency: Min 1x/week Treatment duration: 1 week Interventions: Aspiration precaution training; Compensatory techniques; Patient/family education; Trials of upgraded texture/liquids; Diet toleration management by SLP Recommendations Recommendations for follow up therapy are one component of a multi-disciplinary discharge planning process, led by the attending physician.  Recommendations may be updated based on patient status, additional functional criteria and insurance authorization. Assessment: Orofacial Exam: Orofacial Exam Oral Cavity - Dentition: Adequate natural dentition Anatomy: Anatomy: WFL; Presence of cervical hardware Boluses Administered: Boluses Administered Boluses Administered: Thin liquids (Level 0); Mildly thick liquids (Level 2, nectar thick); Puree; Solid  Oral Impairment Domain: Oral Impairment Domain Lip Closure: No labial escape Tongue control during bolus hold: Posterior escape of less than half of bolus Bolus preparation/mastication: Timely and efficient chewing and mashing Bolus transport/lingual motion: Delayed initiation of tongue motion (oral holding) Oral residue: Residue collection on oral structures Location of oral residue : Tongue Initiation of pharyngeal swallow : Valleculae  Pharyngeal Impairment Domain: Pharyngeal Impairment Domain Soft palate elevation: No bolus between soft palate (SP)/pharyngeal wall (PW) Laryngeal elevation: Complete  superior movement of thyroid cartilage with complete approximation of arytenoids to epiglottic petiole Anterior hyoid excursion: Complete anterior movement Epiglottic movement: Complete inversion Laryngeal vestibule closure: Incomplete, narrow column air/contrast in laryngeal vestibule Pharyngeal stripping wave : Present - complete Pharyngeal contraction (A/P view only): N/A Pharyngoesophageal segment opening: Complete distension and complete duration, no obstruction of flow Tongue base retraction: Trace column of contrast or air between tongue base and PPW Pharyngeal residue: Trace residue within or on pharyngeal structures Location of pharyngeal residue: Tongue base  Esophageal Impairment Domain: Esophageal Impairment Domain Esophageal clearance upright position: -- (not assessed) Pill: No data recorded Penetration/Aspiration Scale Score: Penetration/Aspiration Scale Score 1.  Material does not enter airway: Thin liquids (Level 0); Mildly thick liquids (Level 2, nectar thick); Puree; Solid 2.  Material enters airway, remains ABOVE vocal cords then ejected out: Thin liquids (Level 0) Compensatory Strategies: Compensatory Strategies Compensatory strategies: Yes Multiple swallows: Effective Effective Multiple Swallows: Solid; Puree; Mildly thick liquid (Level 2, nectar thick); Thin liquid (Level 0)   General Information: Caregiver present: No  Diet Prior to this Study: NPO; Cortrak/Small bore NG tube   Temperature : Normal   Respiratory Status: WFL   Supplemental O2: Nasal cannula (3L)   History of Recent Intubation: Yes  Behavior/Cognition: Alert; Cooperative; Pleasant mood Self-Feeding Abilities: Dependent for feeding Baseline vocal quality/speech: Normal No data recorded Volitional Swallow: Able to elicit Exam Limitations: Limited visibility (due to shoulder positioning) Goal Planning: Prognosis for improved oropharyngeal function: Good No data recorded No data recorded Patient/Family Stated Goal: start PO diet  Consulted and agree with results and recommendations: Patient; Nurse Pain: Pain Assessment Pain Assessment: No/denies pain Faces Pain Scale: 0 Facial Expression: 0 Body Movements: 0 Muscle Tension: 0 Compliance with ventilator (intubated pts.): N/A Vocalization (extubated pts.): 0 CPOT Total: 0 End of Session: Start Time:SLP Start Time (ACUTE ONLY): 1355 Stop Time: SLP Stop Time (ACUTE ONLY): 1425 Time Calculation:SLP Time Calculation (min) (ACUTE ONLY): 30 min Charges: SLP Evaluations $ SLP Speech Visit: 1 Visit SLP Evaluations $MBS Swallow: 1 Procedure $Swallowing Treatment: 1 Procedure SLP visit diagnosis: SLP Visit Diagnosis: Dysphagia, oropharyngeal phase (R13.12) Past Medical History: No past medical history on file. Past Surgical History: Past Surgical History: Procedure Laterality Date  ODONTOID SCREW INSERTION N/A 05/09/2023  Procedure: ODONTOID SCREW FIXATION WITH FLOUROSCOPIC GUIDANCE;  Surgeon: Barnett Abu, MD;  Location: [facility] OR;  Service: Neurosurgery;  Laterality: N/A;  ODONTOID SCREW INSERTION N/A 05/09/2023  Procedure: ODONTOID SCREW INSERTION;  Surgeon: Barnett Abu, MD;  Location: [facility] OR;  Service: Neurosurgery;  Laterality: N/A; Ellery Plunk 05/17/2023, 3:52 PM    LOS: 13 days   Total time spent in review of labs and imaging, patient evaluation, formulation of plan, documentation and communication with family: 50 minutes  Lanae Boast, MD [facility]  05/19/2023, 12:00 PM

## 2023-05-20 DIAGNOSIS — I469 Cardiac arrest, cause unspecified: Secondary | ICD-10-CM | POA: Diagnosis not present

## 2023-05-20 NOTE — Progress Notes (Signed)
Physical Therapy Treatment Patient Details Name: Glenn Henderson MRN: [id number] DOB: Apr 24, 1966 Today's Date: 05/20/2023   History of Present Illness 57 y.o. male admitted 12/16 after cardiac arrest with collapse outside of bar[employer] shop; 2 rounds of CPR with ROSC. CT with type II odontoid fx. 12/17 EEG with severe diffuse encephalopathy. 12/19 odontoid screw fixation. 12/21 extubated. PMHx: DMII, OSA, TBI with L weakness, DVT and PE on anticoagulation, hepatitis, peripheral neuropathy, seizures.    PT Comments  Pt tolerates treatment well, continuing to work on transfers. Pt is able to stand with reduced physical assistance but does require assistance to weight shift when stepping to transfer back to bed. L side remains very weak, with LUE weaker than LLE. PT notes only a flicker of muscle activation in LUE at this time. Pt does appear to have a difficult time attending to LUE, however this is tough to assess due to presence of soft collar and cervical precautions limiting cervial ROM. Pt will benefit from continued frequent transfer and mobility training in an effort to reduce falls risk and caregiver burden. Patient will benefit from intensive inpatient follow up therapy, >3 hours/day.    If plan is discharge home, recommend the following: Two people to help with walking and/or transfers;Two people to help with bathing/dressing/bathroom;Assistance with cooking/housework;Direct supervision/assist for medications management;Direct supervision/assist for financial management;Assist for transportation;Help with stairs or ramp for entrance;Assistance with feeding   Can travel by private vehicle        Equipment Recommendations  Hospital bed;Hoyer lift;Wheelchair (measurements PT);Wheelchair cushion (measurements PT)    Recommendations for Other Services       Precautions / Restrictions Precautions Precautions: Cervical;Fall;Other (comment) Precaution Booklet Issued: No Precaution Comments: watch  vitals; L hemi Required Braces or Orthoses: Cervical Brace Cervical Brace: Soft collar;At all times Restrictions Weight Bearing Restrictions Per Provider Order: No     Mobility  Bed Mobility Overal bed mobility: Needs Assistance Bed Mobility: Sit to Supine       Sit to supine: Max assist   General bed mobility comments: pt does assist in sliding u9p in the bed by pulling on bedrail with RUE    Transfers Overall transfer level: Needs assistance Equipment used: 1 person hand held assist Transfers: Sit to/from Stand, Bed to chair/wheelchair/BSC Sit to Stand: Mod assist   Step pivot transfers: Mod assist       General transfer comment: pt requires physical assistance to power up into standing. PT assist during SST transfer to aide in weight shift and stabilize LLE. Pt stands 4 times total during session    Ambulation/Gait                   Stairs             Wheelchair Mobility     Tilt Bed    Modified Rankin (Stroke Patients Only)       Balance Overall balance assessment: Needs assistance Sitting-balance support: No upper extremity supported, Feet supported Sitting balance-Leahy Scale: Fair     Standing balance support: Single extremity supported, Reliant on assistive device for balance Standing balance-Leahy Scale: Poor Standing balance comment: modA                            Cognition Arousal: Alert Behavior During Therapy: WFL for tasks assessed/performed Overall Cognitive Status: Impaired/Different from baseline Area of Impairment: Problem solving, Safety/judgement, Awareness  Memory: Decreased short-term memory Following Commands: Follows one step commands with increased time Safety/Judgement: Decreased awareness of safety, Decreased awareness of deficits Awareness: Emergent Problem Solving: Slow processing, Requires verbal cues          Exercises      General Comments General comments  (skin integrity, edema, etc.): VSS on RA      Pertinent Vitals/Pain Pain Assessment Pain Assessment: Faces Faces Pain Scale: Hurts little more Pain Location: LUE Pain Descriptors / Indicators: Throbbing Pain Intervention(s): Monitored during session    Home Living                          Prior Function            PT Goals (current goals can now be found in the care plan section) Acute Rehab PT Goals Patient Stated Goal: to walk again Progress towards PT goals: Progressing toward goals    Frequency    Min 1X/week      PT Plan      Co-evaluation              AM-PAC PT "6 Clicks" Mobility   Outcome Measure  Help needed turning from your back to your side while in a flat bed without using bedrails?: A Lot Help needed moving from lying on your back to sitting on the side of a flat bed without using bedrails?: A Lot Help needed moving to and from a bed to a chair (including a wheelchair)?: A Lot Help needed standing up from a chair using your arms (e.g., wheelchair or bedside chair)?: A Lot Help needed to walk in hospital room?: Total Help needed climbing 3-5 steps with a railing? : Total 6 Click Score: 10    End of Session Equipment Utilized During Treatment: Gait belt Activity Tolerance: Patient tolerated treatment well Patient left: in bed;with call bell/phone within reach Nurse Communication: Mobility status;Need for lift equipment PT Visit Diagnosis: Muscle weakness (generalized) (M62.81);Difficulty in walking, not elsewhere classified (R26.2);Other symptoms and signs involving the nervous system (R29.898);Other abnormalities of gait and mobility (R26.89);Unsteadiness on feet (R26.81)     Time: 1610-9604 PT Time Calculation (min) (ACUTE ONLY): 36 min  Charges:    $Therapeutic Activity: 23-37 mins PT General Charges $$ ACUTE PT VISIT: 1 Visit                    Arlyss Gandy, PT, DPT Acute Rehabilitation Office [phone]    Arlyss Gandy 05/20/2023, 2:30 PM

## 2023-05-20 NOTE — Progress Notes (Signed)
Patient ID: Glenn Henderson, male   DOB: Oct 14, 1965, 57 y.o.   MRN: [id number] Vital signs are stable Motor function continues to show improvement in the right side left side is about the same Patient is in soft collar reasonably comfortable at this time I will follow him up on January 2 then It is okay to anticoagulate him from a neurosurgical standpoint

## 2023-05-20 NOTE — Plan of Care (Signed)
°  Problem: Education: Goal: Ability to describe self-care measures that may prevent or decrease complications (Diabetes Survival Skills Education) will improve Outcome: Progressing   Problem: Coping: Goal: Ability to adjust to condition or change in health will improve Outcome: Progressing   Problem: Fluid Volume: Goal: Ability to maintain a balanced intake and output will improve Outcome: Progressing   Problem: Health Behavior/Discharge Planning: Goal: Ability to identify and utilize available resources and services will improve Outcome: Progressing Goal: Ability to manage health-related needs will improve Outcome: Progressing   Problem: Metabolic: Goal: Ability to maintain appropriate glucose levels will improve Outcome: Progressing   Problem: Nutritional: Goal: Maintenance of adequate nutrition will improve Outcome: Progressing Goal: Progress toward achieving an optimal weight will improve Outcome: Progressing   Problem: Skin Integrity: Goal: Risk for impaired skin integrity will decrease Outcome: Progressing   Problem: Education: Goal: Knowledge of General Education information will improve Description: Including pain rating scale, medication(s)/side effects and non-pharmacologic comfort measures Outcome: Progressing   Problem: Health Behavior/Discharge Planning: Goal: Ability to manage health-related needs will improve Outcome: Progressing   Problem: Clinical Measurements: Goal: Ability to maintain clinical measurements within normal limits will improve Outcome: Progressing Goal: Will remain free from infection Outcome: Progressing Goal: Diagnostic test results will improve Outcome: Progressing Goal: Respiratory complications will improve Outcome: Progressing   Problem: Activity: Goal: Risk for activity intolerance will decrease Outcome: Progressing   Problem: Nutrition: Goal: Adequate nutrition will be maintained Outcome: Progressing   Problem:  Coping: Goal: Level of anxiety will decrease Outcome: Progressing   Problem: Elimination: Goal: Will not experience complications related to bowel motility Outcome: Progressing Goal: Will not experience complications related to urinary retention Outcome: Progressing   Problem: Pain Management: Goal: General experience of comfort will improve Outcome: Progressing   Problem: Safety: Goal: Ability to remain free from injury will improve Outcome: Progressing   Problem: Skin Integrity: Goal: Risk for impaired skin integrity will decrease Outcome: Progressing

## 2023-05-20 NOTE — TOC Progression Note (Signed)
Transition of Care [facility]) - Progression Note    Patient Details  Name: Glenn Henderson MRN: [id number] Date of Birth: Jul 13, 1965  [facility]) CM/SW Contact  Ronny Bacon, RN Phone Number: 05/20/2023, 3:56 PM  Clinical Narrative:    CIR still following, trying to establish resources patient has for after inpatient rehab care.   Expected Discharge Plan:  (TBD)    Expected Discharge Plan and Services   Discharge Planning Services: CM Consult   Living arrangements for the past 2 months: Single Family Home                                       Social Determinants of Health (SDOH) Interventions SDOH Screenings   Food Insecurity: Patient Unable To Answer (05/07/2023)  Housing: Patient Unable To Answer (05/07/2023)  Transportation Needs: Patient Unable To Answer (05/07/2023)  Utilities: Patient Unable To Answer (05/07/2023)  Tobacco Use: High Risk (05/07/2023)    Readmission Risk Interventions     No data to display

## 2023-05-20 NOTE — Progress Notes (Signed)
Inpatient Rehab Admissions Coordinator:   Once therapy has a chance to work with pt today I will f/u with pt's caregivers about expected level of support after rehab stay.   Estill Dooms, PT, DPT Admissions Coordinator [phone] 05/20/23  11:33 AM

## 2023-05-20 NOTE — Inpatient Diabetes Management (Signed)
Inpatient Diabetes Program Recommendations  AACE/ADA: New Consensus Statement on Inpatient Glycemic Control   Target Ranges:  Prepandial:   less than 140 mg/dL      Peak postprandial:   less than 180 mg/dL (1-2 hours)      Critically ill patients:  140 - 180 mg/dL    Latest Reference Range & Units 05/19/23 07:14 05/19/23 11:48 05/19/23 17:06 05/19/23 19:36 05/19/23 23:54 05/20/23 04:53 05/20/23 07:42  Glucose-Capillary 70 - 99 mg/dL 161 (H) 096 (H) 045 (H) 278 (H) 257 (H) 240 (H) 226 (H)   Review of Glycemic Control  Diabetes history: DM2 Outpatient Diabetes medications: Humalog 2-10 units as directed, Trulicity 1.5 mg Qweek Current orders for Inpatient glycemic control: Semglee 27 units BID, Novolog 4 units Q4H, Novolog 0-20 units Q4H; Vital @ 60 ml/hr  Inpatient Diabetes Program Recommendations:    Insulin: Please consider increasing Semglee to 30 units BID and tube feeding coverage to Novolog 8 units Q4H.  Thanks, Orlando Penner, RN, MSN, CDCES Diabetes Coordinator Inpatient Diabetes Program [phone] (Team Pager from 8am to 5pm)

## 2023-05-20 NOTE — Progress Notes (Signed)
Occupational Therapy Treatment Patient Details Name: Glenn Henderson MRN: [id number] DOB: 1966/04/08 Today's Date: 05/20/2023   History of present illness 57 y.o. male admitted 12/16 after cardiac arrest with collapse outside of bar[employer] shop; 2 rounds of CPR with ROSC. CT with type II odontoid fx. 12/17 EEG with severe diffuse encephalopathy. 12/19 odontoid screw fixation. 12/21 extubated. PMHx: DMII, OSA, TBI with L weakness, DVT and PE on anticoagulation, hepatitis, peripheral neuropathy, seizures.   OT comments  Pt progressing toward established OT goals. Challenging strength and balance with UB mobility and tranfers this session. Continues with profound weakness in LUE despite visual and cognitive attention to mobility of LUE. Pt however, with significantly improved initiation on L. Pt performing STS transfers x3 and transferred to chair with min A and use of stedy this session. Recommending intensive multidisciplinary rehabilitation >3 hours/day to optimize safety and independence in ADL.        If plan is discharge home, recommend the following:  Two people to help with walking and/or transfers;Two people to help with bathing/dressing/bathroom;Assist for transportation;Help with stairs or ramp for entrance;Direct supervision/assist for financial management;Direct supervision/assist for medications management;Assistance with cooking/housework;Assistance with feeding;Supervision due to cognitive status   Equipment Recommendations  Other (comment) (defer)    Recommendations for Other Services      Precautions / Restrictions Precautions Precautions: Cervical;Fall;Other (comment) Precaution Booklet Issued: No Precaution Comments: watch vitals; L hemi Required Braces or Orthoses: Cervical Brace Cervical Brace: Soft collar;At all times Restrictions Weight Bearing Restrictions Per Provider Order: No       Mobility Bed Mobility Overal bed mobility: Needs Assistance Bed Mobility: Sidelying  to Sit, Rolling Rolling: Min assist Sidelying to sit: Min assist, +2 for safety/equipment, +2 for physical assistance       General bed mobility comments: Good initiation; cues for technique    Transfers Overall transfer level: Needs assistance Equipment used: Ambulation equipment used Transfers: Sit to/from Stand, Bed to chair/wheelchair/BSC Sit to Stand: Min assist           General transfer comment: min A up from EOB and stedy 4x Transfer via Lift Equipment: Stedy   Balance Overall balance assessment: Needs assistance Sitting-balance support: No upper extremity supported, Feet supported Sitting balance-Leahy Scale: Fair     Standing balance support: Single extremity supported, Reliant on assistive device for balance Standing balance-Leahy Scale: Poor                             ADL either performed or assessed with clinical judgement   ADL Overall ADL's : Needs assistance/impaired     Grooming: Set up;Sitting;Wash/dry face               Lower Body Dressing: Total assistance;+2 for physical assistance;+2 for safety/equipment;Sitting/lateral leans   Toilet Transfer: Minimal assistance Toilet Transfer Details (indicate cue type and reason): up with stedy                Extremity/Trunk Assessment Upper Extremity Assessment Upper Extremity Assessment: LUE deficits/detail LUE Deficits / Details: 2-/5 grip and 2-/5 at elbow/shoulder LUE: Subluxation noted LUE Sensation: decreased light touch LUE Coordination: decreased fine motor;decreased gross motor   Lower Extremity Assessment Lower Extremity Assessment: Defer to PT evaluation        Vision   Additional Comments: pt did not report   Perception     Praxis      Cognition Arousal: Alert Behavior During Therapy: [facility] for tasks assessed/performed Overall  Cognitive Status: Impaired/Different from baseline Area of Impairment: Problem solving, Safety/judgement, Awareness, Orientation,  Attention                 Orientation Level: Disoriented to, Place Current Attention Level: Selective (decr attention to L side; able to attend on command) Memory: Decreased short-term memory Following Commands: Follows one step commands with increased time Safety/Judgement: Decreased awareness of safety, Decreased awareness of deficits Awareness: Emergent Problem Solving: Slow processing, Requires verbal cues General Comments: Pt A&Ox4. He needs cues to focus on his L extremities to activate the L muscles during mobility. Has difficulty following 2 or more step commands.        Exercises Exercises: Other exercises, General Upper Extremity General Exercises - Upper Extremity Elbow Flexion: AAROM, Left, Seated, 10 reps Elbow Extension: AAROM, Left, Seated, 10 reps Digit Composite Flexion: AROM, Left, Seated, 10 reps    Shoulder Instructions       General Comments VSS on RA    Pertinent Vitals/ Pain       Pain Assessment Pain Assessment: Faces Faces Pain Scale: Hurts little more Pain Location: LUE, neck Pain Descriptors / Indicators: Throbbing, Discomfort  Home Living                                          Prior Functioning/Environment              Frequency  Min 1X/week        Progress Toward Goals  OT Goals(current goals can now be found in the care plan section)  Progress towards OT goals: Progressing toward goals  Acute Rehab OT Goals Patient Stated Goal: get better OT Goal Formulation: With patient Time For Goal Achievement: 05/25/23 Potential to Achieve Goals: Good ADL Goals Pt Will Perform Grooming: with contact guard assist;sitting Pt Will Perform Upper Body Bathing: sitting;with min assist Pt Will Perform Lower Body Bathing: with mod assist;sit to/from stand Pt Will Transfer to Toilet: with max assist;with +2 assist Pt/caregiver will Perform Home Exercise Program: Left upper extremity  Plan      Co-evaluation                  AM-PAC OT "6 Clicks" Daily Activity     Outcome Measure   Help from another person eating meals?: A Lot Help from another person taking care of personal grooming?: A Lot Help from another person toileting, which includes using toliet, bedpan, or urinal?: Total Help from another person bathing (including washing, rinsing, drying)?: A Lot Help from another person to put on and taking off regular upper body clothing?: A Lot Help from another person to put on and taking off regular lower body clothing?: Total 6 Click Score: 10    End of Session Equipment Utilized During Treatment: Other (comment) (stedy)  OT Visit Diagnosis: Unsteadiness on feet (R26.81);Muscle weakness (generalized) (M62.81);Other symptoms and signs involving cognitive function;History of falling (Z91.81);Pain   Activity Tolerance Patient tolerated treatment well   Patient Left with call bell/phone within reach;with nursing/sitter in room;in chair;with chair alarm set   Nurse Communication Mobility status        Time: 9562-1308 OT Time Calculation (min): 28 min  Charges: OT General Charges $OT Visit: 1 Visit OT Treatments $Self Care/Home Management : 8-22 mins $Therapeutic Activity: 8-22 mins  Myrla Halsted, OTD, OTR/L [facility] Acute Rehabilitation Office: [phone]   Myrla Halsted 05/20/2023,  3:42 PM

## 2023-05-20 NOTE — Progress Notes (Signed)
PROGRESS NOTE Glenn Henderson  ZOX:[id number] DOB: 02/22/1966 DOA: 05/06/2023 PCP: Fleet Contras, MD  Brief Narrative/Hospital Course: Glenn Henderson is a 57 y.o. male with a history of hyperlipidemia, OSA, hepatitis, major depressive disorder, anxiety, PTSD, tobacco use, diabetes mellitus type 2, peripheral neuropathy, chronic radicular lumbar pain with chronic pain syndrome.  Patient presented secondary to out of hospital PEA arrest requiring 2 rounds of CPR prior to ROSC.  Patient intubated and admitted to the ICU for management of respiratory failure.  Patient also suffered a cervical spine fracture involving C1 requiring odontoid screw fixation by neurosurgery.  Hospitalization further complicated by multifocal pneumonia, pain management, delirium, suicidal ideation.At this point patient is for acute inpatient rehabilitation pending improvement in vitals and pain tolerance.   Subjective: Seen and examined, Overnight vitals/labs/events reviewed  Resting well no complaints Was sleeping before I woke him up On dysphagia 3 diet and also tube feeding  Assessment and Plan: Principal Problem:   Cardiac arrest [facility]) Active Problems:   Closed fracture of nasal bones   Delirium due to another medical condition   Lower GI bleed   Closed fracture of odontoid process of axis [facility])  S/p PEA cardiac arrest out of hospital: Unclear etiology. EEG without evidence of seizures. WJX:BJYNWG RV, LVEF of 60-65% and no regional wall motion abnormality.  Currently doing well. He is medically stable   Acute respiratory failure with hypoxia In the setting of cardiac arrest needing mechanical ventilation until 12/21.  Doing well on 4L [place], resp status is stable  Hematochezia-self limited-likely diverticular: Hemoglobin and vitals stable, anticoagulation from home has been on hold will consulted GI-discussed with Dr. Tomasa Rand CTA angio bleed unremarkable.Per GI okay to resume his anticoagulation 12/31  Recent Labs   Lab 05/18/23 0910 05/18/23 1330 05/18/23 1954 05/19/23 0316 05/20/23 0350  HGB 12.7* 12.2* 12.5* 12.2* 12.3*  HCT 38.4* 38.8* 38.2* 37.7* 38.1*    Multifocal pneumonia CT chest>bilateral lower lobe infiltrates consistent with pneumonia .  Tracheal culture showed Enterobacter cloacae and Pseudomonas -and being treated with cefepime Day #8 today we will stop and monitor off antibiotics as he is overall doing well, does have leukocytosis and slowly downtrending but afebrile Recent Labs  Lab 05/14/23 0805 05/18/23 0352 05/19/23 0316 05/20/23 0350  WBC 12.1* 16.7* 18.6* 17.3*  PROCALCITON <0.10  --   --   --     Low TSH Thyroid levels consistent with hyperthyroidism with low TSH, elevated free T4. Total T3 is normal, however. Patient started on metoprolol for cardiac effects and appears to be overall stable.  Will need endocrinology follow-up.   Delirium Anxiety Depression Mood remains stable but he continues to have ongoing pain issues.  Continue on home Klonopin. Delirium likely in the setting of multiple meds,prolonged hospitalization.     Unstable C-spine C1-C2 fracture: Secondary to fall outside the hospital.  Seen by Neurosurgery with odontoid screw fixation performed on 12/19 with subsequent need for replacement of odontoid screw. Continue soft cervical collar, plan for rehab.  Continue pain control.   Nasal bone fractures Noted incidentally on CT imaging. Secondary to fall with head trauma. ENT with recommendations for no intervention.   Suicidal ideation Patient expressed suicidal ideation on 12/22-seen by psychiatry:IVC, Effexor, Seroquel, tapering Klonopin.  Seen again 12/26 advised one-to-one until discharge however overall low acute risk for suicide and has consistently denied in the last 3 days.   History of CVA History of TBI With left sided weakness PTA.  Stable.   History of DVT/PE Patient  is on Eliquis as an outpatient which was held on admission.  Neurosurgery cleared patient for resumption of DVT prophylaxis.Discussed with Dr Tomasa Rand, ok to resume eliquis Tuesday, messaged Dr Danielle Dess if okay to resume it.   Elevated AST/ALT Unclear etiology. No obvious symptoms. Patient does have an elevated temperature, but no pain located in RUQ. [place] this admission significant for evidence of hepatic steatosis and hepatocellular disease. Lipitor held. CK normal.  Lft has nicely improved as below, will resume statin  Recent Labs  Lab 05/14/23 0805 05/15/23 0425  AST 86* 46*  ALT 150* 113*  ALKPHOS 118 102  BILITOT 0.5 0.6  PROT 7.3 7.1  ALBUMIN 2.6* 2.5*     Diabetes mellitus type 2 with uncontrolled hyperglycemia: PTA on Humalog  2-10 uSSI, Trulicity 1.5 mg weekly . Uncontrolled A1c at 10, and here w/ uncontrolled hyperglycemia. continue on current  aspary 4 u q4hr, 27 units Semglee twice daily and ssi q4hr> once tube feeding weaned off we can adjust insulin further Recent Labs  Lab 05/19/23 1706 05/19/23 1936 05/19/23 2354 05/20/23 0453 05/20/23 0742  GLUCAP 244* 278* 257* 240* 226*    Peripheral neuropathy Stable on home Neurontin.  Also on Cymbalta at home held by psychiatry   Chronic pain On oxycodone PTA by Dr. Tollie Eth in [place], [place]. Per PDMP review, patient has not been filling reported prescription of oxycodone 30 mg 6 times daily. Unclear of validity. Oxycodone increased to 5-10 mg q 4hours.  Patient somewhat upset that he did not get his oxycodone while he was in sleep-clinic to him that meds are as needed we discussed about potential downsides of being on narcotics, will not increase dose further  Primary hypertension BP is well-controlled.  Continue home clonidine twice daily, metoprolol  Dysphagia Patient failed swallow study postextubation. Speech therapy following closely on dysphagia 3 diet and also on tube feeding hopefully can wean off tube feeding and remove NG tube, RD SLP following.  Discussed with nursing staff  to coordinate.  Class I Obesity: Patient's Body mass index is 35.35 kg/m. : Will benefit with PCP follow-up, weight loss  healthy lifestyle and outpatient sleep evaluation.  DVT prophylaxis: Place and maintain sequential compression device Start: 05/18/23 1347 Place and maintain sequential compression device Start: 05/18/23 1346 SCDs Start: 05/06/23 1819 Code Status:   Code Status: Full Code Family Communication: plan of care discussed with patient at bedside. Patient status is: Remains hospitalized because of severity of illness Level of care: Progressive   Dispo: The patient is from: HOME            Anticipated disposition: CIR eval Objective: Vitals last 24 hrs: Vitals:   05/19/23 2225 05/19/23 2355 05/20/23 0500 05/20/23 0837  BP: (!) 157/90 132/82  (!) 151/80  Pulse: 99 100 90 95  Resp:   18 16  Temp:  98.5 F (36.9 C) 97.6 F (36.4 C)   TempSrc:  Axillary Axillary   SpO2:  96% 94%   Weight:      Height:       Weight change:   Physical Examination: General exam: alert awake, oriented at baseline, older than stated age HEENT:Oral mucosa moist, Ear/Nose WNL grossly Respiratory system: Bilaterally clear BS,no use of accessory muscle Cardiovascular system: S1 & S2 +, No JVD. Gastrointestinal system: Abdomen soft,NT,ND, BS+ Nervous System: Alert, awake, moving rt side well, has left sided weakness Extremities: LE edema neg,distal peripheral pulses palpable and warm.  Skin: No rashes,no icterus. MSK: Normal muscle bulk,tone, power  Medications reviewed:  Scheduled Meds:  Chlorhexidine Gluconate Cloth  6 each Topical Daily   clonazepam  0.25 mg Oral TID   cloNIDine  0.1 mg Oral BID   docusate  100 mg Oral BID   famotidine  20 mg Oral BID   feeding supplement  237 mL Oral BID BM   feeding supplement (PROSource TF20)  60 mL Per Tube BID   folic acid  1 mg Oral Daily   free water  200 mL Per Tube Q4H   gabapentin  300 mg Oral Q8H   influenza vac split trivalent PF   0.5 mL Intramuscular Tomorrow-1000   insulin aspart  0-20 Units Subcutaneous Q4H   insulin aspart  4 Units Subcutaneous Q4H   insulin glargine-yfgn  27 Units Subcutaneous BID   methocarbamol  500 mg Oral TID   metoprolol tartrate  25 mg Oral BID   multivitamin with minerals  1 tablet Oral Daily   mouth rinse  15 mL Mouth Rinse 4 times per day   pantoprazole  40 mg Oral BID   polyethylene glycol  17 g Oral BID   QUEtiapine  50 mg Oral BID   thiamine  100 mg Oral Daily   venlafaxine XR  150 mg Oral Q breakfast   Continuous Infusions:  ceFEPime (MAXIPIME) IV 2 g (05/20/23 0457)   feeding supplement (VITAL 1.5 CAL) 60 mL/hr at 05/20/23 0426      Diet Order             DIET DYS 3 Room service appropriate? Yes; Fluid consistency: Thin  Diet effective now                 Nutrition Problem: Inadequate oral intake Etiology: acute illness Signs/Symptoms: NPO status Interventions: Refer to RD note for recommendations, Tube feeding   Intake/Output Summary (Last 24 hours) at 05/20/2023 1003 Last data filed at 05/20/2023 0900 Gross per 24 hour  Intake 3900 ml  Output 3350 ml  Net 550 ml   Net IO Since Admission: -11,621.14 mL [05/20/23 1003]  Wt Readings from Last 3 Encounters:  05/19/23 124.9 kg     Unresulted Labs (From admission, onward)    None     Data Reviewed: I have personally reviewed following labs and imaging studies CBC: Recent Labs  Lab 05/14/23 0805 05/18/23 0352 05/18/23 0910 05/18/23 1330 05/18/23 1954 05/19/23 0316 05/20/23 0350  WBC 12.1* 16.7*  --   --   --  18.6* 17.3*  NEUTROABS 7.4  --   --   --   --   --   --   HGB 11.9* 13.0 12.7* 12.2* 12.5* 12.2* 12.3*  HCT 36.7* 41.0 38.4* 38.8* 38.2* 37.7* 38.1*  MCV 87.2 90.1  --   --   --  89.1 88.6  PLT 401* 561*  --   --   --  527* 552*   Basic Metabolic Panel:  Recent Labs  Lab 05/14/23 0805 05/15/23 0425 05/18/23 0352 05/19/23 0316 05/20/23 0350  NA 138 138 140 136 136  K 3.6 3.7 4.1  4.0 3.9  CL 108 106 103 101 99  CO2 21* 25 27 27 28   GLUCOSE 237* 251* 218* 235* 225*  BUN 17 14 13 19 18   CREATININE 0.88 0.86 0.91 0.86 0.84  CALCIUM 8.7* 8.9 9.5 9.3 9.2   GFR: Estimated Creatinine Clearance: 137.9 mL/min (by C-G formula based on SCr of 0.84 mg/dL). Liver Function Tests:  Recent Labs  Lab 05/14/23 0805 05/15/23  0425  AST 86* 46*  ALT 150* 113*  ALKPHOS 118 102  BILITOT 0.5 0.6  PROT 7.3 7.1  ALBUMIN 2.6* 2.5*   Recent Results (from the past 240 hours)  Culture, Respiratory w Gram Stain     Status: None   Collection Time: 05/10/23 12:32 PM   Specimen: Tracheal Aspirate; Respiratory  Result Value Ref Range Status   Specimen Description TRACHEAL ASPIRATE  Final   Special Requests NONE  Final   Gram Stain   Final    ABUNDANT WBC PRESENT, PREDOMINANTLY PMN RARE GRAM NEGATIVE RODS RARE BUDDING YEAST SEEN Performed at [facility], [address], [place], [place] [address]    Culture   Final    ABUNDANT ENTEROBACTER CLOACAE RARE PSEUDOMONAS AERUGINOSA    Report Status 05/13/2023 FINAL  Final   Organism ID, Bacteria ENTEROBACTER CLOACAE  Final   Organism ID, Bacteria PSEUDOMONAS AERUGINOSA  Final      Susceptibility   Enterobacter cloacae - MIC*    CEFEPIME <=0.12 SENSITIVE Sensitive     CEFTAZIDIME <=1 SENSITIVE Sensitive     CIPROFLOXACIN <=0.25 SENSITIVE Sensitive     GENTAMICIN <=1 SENSITIVE Sensitive     IMIPENEM <=0.25 SENSITIVE Sensitive     TRIMETH/SULFA <=20 SENSITIVE Sensitive     PIP/TAZO <=4 SENSITIVE Sensitive ug/mL    * ABUNDANT ENTEROBACTER CLOACAE   Pseudomonas aeruginosa - MIC*    CEFTAZIDIME 4 SENSITIVE Sensitive     CIPROFLOXACIN 0.5 SENSITIVE Sensitive     GENTAMICIN <=1 SENSITIVE Sensitive     IMIPENEM 1 SENSITIVE Sensitive     CEFEPIME 4 SENSITIVE Sensitive     * RARE PSEUDOMONAS AERUGINOSA    Antimicrobials/Microbiology: Anti-infectives (From admission, onward)    Start     Dose/Rate Route Frequency Ordered Stop    05/13/23 1300  ceFEPIme (MAXIPIME) 2 g in sodium chloride 0.9 % 100 mL IVPB        2 g 200 mL/hr over 30 Minutes Intravenous Every 8 hours 05/13/23 1156     05/10/23 1330  piperacillin-tazobactam (ZOSYN) IVPB 3.375 g  Status:  Discontinued        3.375 g 12.5 mL/hr over 240 Minutes Intravenous Every 8 hours 05/10/23 1240 05/13/23 1156   05/06/23 2000  Ampicillin-Sulbactam (UNASYN) 3 g in sodium chloride 0.9 % 100 mL IVPB  Status:  Discontinued        3 g 200 mL/hr over 30 Minutes Intravenous Every 6 hours 05/06/23 1919 05/10/23 1214         Component Value Date/Time   SDES TRACHEAL ASPIRATE 05/10/2023 1232   SPECREQUEST NONE 05/10/2023 1232   CULT  05/10/2023 1232    ABUNDANT ENTEROBACTER CLOACAE RARE PSEUDOMONAS AERUGINOSA    REPTSTATUS 05/13/2023 FINAL 05/10/2023 1232     Radiology Studies: CT ANGIO GI BLEED Result Date: 05/18/2023 CLINICAL DATA:  Rectal bleeding EXAM: CTA ABDOMEN AND PELVIS WITHOUT AND WITH CONTRAST TECHNIQUE: Multidetector CT imaging of the abdomen and pelvis was performed using the standard protocol during bolus administration of intravenous contrast. Multiplanar reconstructed images and MIPs were obtained and reviewed to evaluate the vascular anatomy. RADIATION DOSE REDUCTION: This exam was performed according to the departmental dose-optimization program which includes automated exposure control, adjustment of the mA and/or kV according to patient size and/or use of iterative reconstruction technique. CONTRAST:  75mL OMNIPAQUE IOHEXOL 350 MG/ML SOLN COMPARISON:  05/17/2023, 05/06/2023 FINDINGS: This evaluation is severely limited due to suboptimal timing of the contrast bolus and retained barium throughout  the colon. VASCULAR Aorta: Normal caliber aorta without aneurysm, dissection, vasculitis or significant stenosis. Mild atherosclerosis. Celiac: Patent without evidence of aneurysm, dissection, vasculitis or significant stenosis. SMA: Patent without evidence of  aneurysm, dissection, vasculitis or significant stenosis. Renals: Both renal arteries are patent without evidence of aneurysm, dissection, vasculitis, fibromuscular dysplasia or significant stenosis. IMA: Patent without evidence of aneurysm, dissection, vasculitis or significant stenosis. Inflow: Patent without evidence of aneurysm, dissection, vasculitis or significant stenosis. Proximal Outflow: Bilateral common femoral and visualized portions of the superficial and profunda femoral arteries are patent without evidence of aneurysm, dissection, vasculitis or significant stenosis. Mild atherosclerosis. Veins: IVC filter identified. 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 There are numerous venous collaterals throughout the pelvis and abdominal wall. Review of the MIP images confirms the above findings. NON-VASCULAR Lower chest: Scattered hypoventilatory changes. No acute pleural or parenchymal lung disease. Hepatobiliary: No focal liver abnormality is seen. No gallstones, gallbladder wall thickening, or biliary dilatation. Pancreas: Unremarkable. No pancreatic ductal dilatation or surrounding inflammatory changes. Spleen: Multiple splenules within the left upper quadrant again noted. Adrenals/Urinary Tract: Adrenal glands are unremarkable. Kidneys are normal, without renal calculi, focal lesion, or hydronephrosis. Bladder is decompressed with a Foley catheter. Stomach/Bowel: The evaluation for active gastrointestinal bleeding is limited due to timing of contrast bolus and retained barium throughout the ascending, transverse, and descending colon. There is no evidence of active gastrointestinal bleeding within the rectosigmoid colon, stomach, or small bowel. No bowel obstruction or ileus. Normal appendix right lower quadrant. No bowel wall thickening or inflammatory change. Enteric catheter tip within the region of the gastric pylorus. Lymphatic: No pathologic  adenopathy within the abdomen or pelvis. Reproductive: Prostate is unremarkable. Other: No free fluid or free intraperitoneal gas. No abdominal wall hernia. Musculoskeletal: No acute or destructive bony abnormalities. Reconstructed images demonstrate no additional findings. IMPRESSION: VASCULAR 1. Nondiagnostic evaluation for active gastrointestinal bleeding within the ascending, transverse, and descending colon due to retained barium. 2. No evidence of active gastrointestinal bleeding within the stomach, small bowel, or rectosigmoid colon. Evaluation in these regions is limited due to suboptimal timing of the contrast bolus. 3.  Aortic Atherosclerosis (ICD10-I70.0). 4. 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 NON-VASCULAR 1. No acute intra-abdominal or intrapelvic process. Electronically Signed   By: Sharlet Salina M.D.   On: 05/18/2023 15:42     LOS: 14 days   Total time spent in review of labs and imaging, patient evaluation, formulation of plan, documentation and communication with family: 35 minutes  Lanae Boast, MD [facility]  05/20/2023, 10:03 AM

## 2023-05-20 NOTE — Progress Notes (Signed)
Inpatient Rehab Admissions Coordinator:   Spoke to mom briefly on the phone and she confirms pt would have some help from s/o and son.  I do think he will progress well with therapy and likely be supervision to min assist level at the most, but will need 24/7 caregiver at discharge.  She is going to call me back to confirm.  I also attempted to call pt's s/o Arlie, but no answer and unable to leave a voicemail.  Ms. Clementeen Graham to let her know I'm trying to reach her.    Estill Dooms, PT, DPT Admissions Coordinator [phone] 05/20/23  3:53 PM

## 2023-05-21 MED ORDER — GABAPENTIN 300 MG PO CAPS
300.0000 mg | ORAL_CAPSULE | Freq: Once | ORAL | Status: AC
Start: 2023-05-21 — End: 2023-05-21
